# Patient Record
Sex: Female | Born: 1990 | Race: Black or African American | Hispanic: No | Marital: Married | Smoking: Never smoker
Health system: Southern US, Community
[De-identification: ages and names within clinical notes are randomized; demographics above are authoritative.]

## PROBLEM LIST (undated history)

## (undated) DIAGNOSIS — Z973 Presence of spectacles and contact lenses: Secondary | ICD-10-CM

## (undated) DIAGNOSIS — Z9049 Acquired absence of other specified parts of digestive tract: Secondary | ICD-10-CM

## (undated) DIAGNOSIS — N979 Female infertility, unspecified: Secondary | ICD-10-CM

## (undated) DIAGNOSIS — K5900 Constipation, unspecified: Secondary | ICD-10-CM

## (undated) DIAGNOSIS — K5909 Other constipation: Secondary | ICD-10-CM

## (undated) DIAGNOSIS — N736 Female pelvic peritoneal adhesions (postinfective): Secondary | ICD-10-CM

## (undated) DIAGNOSIS — E559 Vitamin D deficiency, unspecified: Secondary | ICD-10-CM

## (undated) HISTORY — DX: Vitamin D deficiency, unspecified: E55.9

---
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
## 2017-07-19 DIAGNOSIS — E559 Vitamin D deficiency, unspecified: Secondary | ICD-10-CM | POA: Insufficient documentation

## 2017-07-19 DIAGNOSIS — B009 Herpesviral infection, unspecified: Secondary | ICD-10-CM | POA: Insufficient documentation

## 2017-07-19 HISTORY — DX: Herpesviral infection, unspecified: B00.9

## 2017-07-19 LAB — TSH: TSH: 1.83 (ref <=5.90)

## 2017-07-19 LAB — VITAMIN D 25 HYDROXY (VIT D DEFICIENCY, FRACTURES): Vit D, 25-Hydroxy: 25

## 2017-07-19 LAB — HIV ANTIBODY (ROUTINE TESTING W REFLEX): HIV 1&2 Ab, 4th Generation: NONREACTIVE

## 2017-07-19 LAB — CBC AND DIFFERENTIAL: Hemoglobin: 13 (ref 12.0–16.0)

## 2017-07-19 LAB — HM HEPATITIS C SCREENING LAB: HM Hepatitis Screen: NEGATIVE

## 2017-07-19 LAB — HM HIV SCREENING LAB: HM HIV Screening: NEGATIVE

## 2017-07-19 LAB — HEPATITIS B SURFACE ANTIGEN: Hepatitis B Surface Ag: NONREACTIVE

## 2018-01-21 LAB — HEMOGLOBIN A1C: Hemoglobin A1C: 4.6

## 2018-01-31 LAB — VITAMIN D 25 HYDROXY (VIT D DEFICIENCY, FRACTURES): Vit D, 25-Hydroxy: 29

## 2018-04-26 DIAGNOSIS — N979 Female infertility, unspecified: Secondary | ICD-10-CM | POA: Diagnosis not present

## 2018-04-26 DIAGNOSIS — N898 Other specified noninflammatory disorders of vagina: Secondary | ICD-10-CM | POA: Diagnosis not present

## 2018-08-31 DIAGNOSIS — N979 Female infertility, unspecified: Secondary | ICD-10-CM | POA: Diagnosis not present

## 2018-08-31 DIAGNOSIS — E559 Vitamin D deficiency, unspecified: Secondary | ICD-10-CM | POA: Diagnosis not present

## 2018-08-31 DIAGNOSIS — R87610 Atypical squamous cells of undetermined significance on cytologic smear of cervix (ASC-US): Secondary | ICD-10-CM | POA: Diagnosis not present

## 2018-08-31 DIAGNOSIS — Z124 Encounter for screening for malignant neoplasm of cervix: Secondary | ICD-10-CM | POA: Diagnosis not present

## 2018-08-31 DIAGNOSIS — N898 Other specified noninflammatory disorders of vagina: Secondary | ICD-10-CM | POA: Diagnosis not present

## 2018-08-31 DIAGNOSIS — Z6829 Body mass index (BMI) 29.0-29.9, adult: Secondary | ICD-10-CM | POA: Diagnosis not present

## 2018-08-31 DIAGNOSIS — Z01411 Encounter for gynecological examination (general) (routine) with abnormal findings: Secondary | ICD-10-CM | POA: Diagnosis not present

## 2018-08-31 LAB — HM PAP SMEAR: HM Pap smear: NEGATIVE

## 2018-12-21 ENCOUNTER — Ambulatory Visit (INDEPENDENT_AMBULATORY_CARE_PROVIDER_SITE_OTHER): Payer: 59 | Admitting: Physician Assistant

## 2018-12-21 ENCOUNTER — Encounter: Payer: Self-pay | Admitting: Physician Assistant

## 2018-12-21 ENCOUNTER — Other Ambulatory Visit: Payer: Self-pay

## 2018-12-21 VITALS — BP 138/80 | HR 80 | Temp 98.3°F | Ht 62.5 in | Wt 163.0 lb

## 2018-12-21 DIAGNOSIS — E663 Overweight: Secondary | ICD-10-CM | POA: Diagnosis not present

## 2018-12-21 DIAGNOSIS — R635 Abnormal weight gain: Secondary | ICD-10-CM

## 2018-12-21 DIAGNOSIS — Z0001 Encounter for general adult medical examination with abnormal findings: Secondary | ICD-10-CM | POA: Diagnosis not present

## 2018-12-21 DIAGNOSIS — K5909 Other constipation: Secondary | ICD-10-CM | POA: Insufficient documentation

## 2018-12-21 DIAGNOSIS — N979 Female infertility, unspecified: Secondary | ICD-10-CM | POA: Diagnosis not present

## 2018-12-21 DIAGNOSIS — Z136 Encounter for screening for cardiovascular disorders: Secondary | ICD-10-CM

## 2018-12-21 DIAGNOSIS — Z13220 Encounter for screening for lipoid disorders: Secondary | ICD-10-CM | POA: Diagnosis not present

## 2018-12-21 NOTE — Patient Instructions (Signed)
It was great to see you!  Please go to the lab for blood work.   Our office will call you with your results unless you have chosen to receive results via MyChart.  If your blood work is normal we will follow-up each year for physicals and as scheduled for chronic medical problems.  If anything is abnormal we will treat accordingly and get you in for a follow-up.  *Goals for you: 1. Start daily prenatal vitamin 2. Work on cutting carbohydrate portions down -- you can still have them, but try to eat less! 3. Start a gentle basic exercise plan (ex -- walking 3 x a week for 20 min)  Take care,  [facility] Maintenance, Female Adopting a healthy lifestyle and getting preventive care are important in promoting health and wellness. Ask your health care provider about:  The right schedule for you to have regular tests and exams.  Things you can do on your own to prevent diseases and keep yourself healthy. What should I know about diet, weight, and exercise? Eat a healthy diet   Eat a diet that includes plenty of vegetables, fruits, low-fat dairy products, and lean protein.  Do not eat a lot of foods that are high in solid fats, added sugars, or sodium. Maintain a healthy weight Body mass index (BMI) is used to identify weight problems. It estimates body fat based on height and weight. Your health care provider can help determine your BMI and help you achieve or maintain a healthy weight. Get regular exercise Get regular exercise. This is one of the most important things you can do for your health. Most adults should:  Exercise for at least 150 minutes each week. The exercise should increase your heart rate and make you sweat (moderate-intensity exercise).  Do strengthening exercises at least twice a week. This is in addition to the moderate-intensity exercise.  Spend less time sitting. Even light physical activity can be beneficial. Watch cholesterol and blood lipids Have  your blood tested for lipids and cholesterol at 26 years of age, then have this test every 5 years. Have your cholesterol levels checked more often if:  Your lipid or cholesterol levels are high.  You are older than 26 years of age.  You are at high risk for heart disease. What should I know about cancer screening? Depending on your health history and family history, you may need to have cancer screening at various ages. This may include screening for:  Breast cancer.  Cervical cancer.  Colorectal cancer.  Skin cancer.  Lung cancer. What should I know about heart disease, diabetes, and high blood pressure? Blood pressure and heart disease  High blood pressure causes heart disease and increases the risk of stroke. This is more likely to develop in people who have high blood pressure readings, are of African descent, or are overweight.  Have your blood pressure checked: ? Every 3-5 years if you are 22-32 years of age. ? Every year if you are 22 years old or older. Diabetes Have regular diabetes screenings. This checks your fasting blood sugar level. Have the screening done:  Once every three years after age 42 if you are at a normal weight and have a low risk for diabetes.  More often and at a younger age if you are overweight or have a high risk for diabetes. What should I know about preventing infection? Hepatitis B If you have a higher risk for hepatitis B, you should be screened  for this virus. Talk with your health care provider to find out if you are at risk for hepatitis B infection. Hepatitis C Testing is recommended for:  Everyone born from [id number] through 1963.  Anyone with known risk factors for hepatitis C. Sexually transmitted infections (STIs)  Get screened for STIs, including gonorrhea and chlamydia, if: ? You are sexually active and are younger than 26 years of age. ? You are older than 26 years of age and your health care provider tells you that you are at  risk for this type of infection. ? Your sexual activity has changed since you were last screened, and you are at increased risk for chlamydia or gonorrhea. Ask your health care provider if you are at risk.  Ask your health care provider about whether you are at high risk for HIV. Your health care provider may recommend a prescription medicine to help prevent HIV infection. If you choose to take medicine to prevent HIV, you should first get tested for HIV. You should then be tested every 3 months for as long as you are taking the medicine. Pregnancy  If you are about to stop having your period (premenopausal) and you may become pregnant, seek counseling before you get pregnant.  Take 400 to 800 micrograms (mcg) of folic acid every day if you become pregnant.  Ask for birth control (contraception) if you want to prevent pregnancy. Osteoporosis and menopause Osteoporosis is a disease in which the bones lose minerals and strength with aging. This can result in bone fractures. If you are 26 years old or older, or if you are at risk for osteoporosis and fractures, ask your health care provider if you should:  Be screened for bone loss.  Take a calcium or vitamin D supplement to lower your risk of fractures.  Be given hormone replacement therapy (HRT) to treat symptoms of menopause. Follow these instructions at home: Lifestyle  Do not use any products that contain nicotine or tobacco, such as cigarettes, e-cigarettes, and chewing tobacco. If you need help quitting, ask your health care provider.  Do not use street drugs.  Do not share needles.  Ask your health care provider for help if you need support or information about quitting drugs. Alcohol use  Do not drink alcohol if: ? Your health care provider tells you not to drink. ? You are pregnant, may be pregnant, or are planning to become pregnant.  If you drink alcohol: ? Limit how much you use to 0-1 drink a day. ? Limit intake if you  are breastfeeding.  Be aware of how much alcohol is in your drink. In the [place] one drink equals one 12 oz bottle of beer (355 mL), one 5 oz glass of wine (148 mL), or one 1 oz glass of hard liquor (44 mL). General instructions  Schedule regular health, dental, and eye exams.  Stay current with your vaccines.  Tell your health care provider if: ? You often feel depressed. ? You have ever been abused or do not feel safe at home. Summary  Adopting a healthy lifestyle and getting preventive care are important in promoting health and wellness.  Follow your health care provider's instructions about healthy diet, exercising, and getting tested or screened for diseases.  Follow your health care provider's instructions on monitoring your cholesterol and blood pressure. This information is not intended to replace advice given to you by your health care provider. Make sure you discuss any questions you have with your health care  provider. Document Released: 10/06/2010 Document Revised: 03/16/2018 Document Reviewed: 03/16/2018 Elsevier Patient Education  2020 [employer].

## 2018-12-21 NOTE — Progress Notes (Signed)
Caitlin Robles is a 28 y.o. female here to Establish Care.  I acted as a Neurosurgeon for [employer], PA-C Corky Mull, LPN  History of Present Illness:   Chief Complaint  Patient presents with  . Establish Care  . Annual Exam    Not fasting    Acute Concerns: Weight gain --patient reports that she is currently at her highest weight that she is ever been a.  She endorses poor dietary habits, mostly eats large portions of sweets and carbs, and also does not exercise.  She has never tried any specific weight management program. Infertility --patient reports that she has been trying to get pregnant for 2 years, she is going to see a new fertility specialist next month.  She was on Clomid at one point, she is not on this anymore.  She denies any prior history or diagnosis of PCOS, hypertension, thyroid issues, blood sugar issues.  She is not on a prenatal at this time.  Chronic Issues: Chronic constipation --denies any significant issues with this, and is been a long-term issue for her.  She knows that it can be related to her diet and she tries to work on this as able.  Denies hemorrhoids or blood in stool.  Denies colon cancer in family.  Health Maintenance: Immunizations -- UTD Colonoscopy -- N/A Mammogram -- N/A PAP -- done 08/31/18, Normal  Bone Density -- N/A Diet --eats a wide variety of foods but states that she overeats carbohydrate portions Caffeine intake --minimal Sleep habits --good Exercise --none Weight -- Weight: 163 lb (73.9 kg)  Mood --good Alcohol --very occasional, social, no concern Tobacco --none  Depression screen PHQ 2/9 12/21/2018  Decreased Interest 0  Down, Depressed, Hopeless 0  PHQ - 2 Score 0    No flowsheet data found.   Other providers/specialists: Patient Care Team: Jarold Motto, [place] as PCP - General (Physician Assistant)   Past Medical History:  Diagnosis Date  . Constipation   . Vitamin D deficiency      Social History    Socioeconomic History  . Marital status: Married    Spouse name: Not on file  . Number of children: Not on file  . Years of education: Not on file  . Highest education level: Not on file  Occupational History  . Not on file  Social Needs  . Financial resource strain: Not on file  . Food insecurity    Worry: Not on file    Inability: Not on file  . Transportation needs    Medical: Not on file    Non-medical: Not on file  Tobacco Use  . Smoking status: Never Smoker  . Smokeless tobacco: Never Used  Substance and Sexual Activity  . Alcohol use: Not Currently  . Drug use: Never  . Sexual activity: Yes    Birth control/protection: None  Lifestyle  . Physical activity    Days per week: Not on file    Minutes per session: Not on file  . Stress: Not on file  Relationships  . Social Musician on phone: Not on file    Gets together: Not on file    Attends religious service: Not on file    Active member of club or organization: Not on file    Attends meetings of clubs or organizations: Not on file    Relationship status: Not on file  . Intimate partner violence    Fear of current or ex partner: Not on file  Emotionally abused: Not on file    Physically abused: Not on file    Forced sexual activity: Not on file  Other Topics Concern  . Not on file  Social History Narrative  . Not on file    Past Surgical History:  Procedure Laterality Date  . COLON SURGERY  09-16-1992   had part of intestines removed, she was premature    History reviewed. No pertinent family history.  No Known Allergies   Current Medications:   Current Outpatient Medications:  .  ibuprofen (ADVIL) 200 MG tablet, Take 400 mg by mouth as needed., Disp: , Rfl:    Review of Systems:   Review of Systems  Constitutional: Negative for chills, fever, malaise/fatigue and weight loss.  HENT: Negative for hearing loss, sinus pain and sore throat.   Eyes: Negative for blurred vision.   Respiratory: Negative for cough and shortness of breath.   Cardiovascular: Negative for chest pain, palpitations and leg swelling.  Gastrointestinal: Negative for abdominal pain, constipation, diarrhea, heartburn, nausea and vomiting.  Genitourinary: Negative for dysuria, frequency and urgency.  Musculoskeletal: Negative for back pain, myalgias and neck pain.  Skin: Negative for itching and rash.  Neurological: Negative for dizziness, tingling, seizures, loss of consciousness and headaches.  Endo/Heme/Allergies: Negative for polydipsia.  Psychiatric/Behavioral: Negative for depression. The patient is not nervous/anxious.   All other systems reviewed and are negative.   Vitals:   Vitals:   12/21/18 1445 12/21/18 1516  BP: 140/86 138/80  Pulse: 80   Temp: 98.3 F (36.8 C)   TempSrc: Temporal   Weight: 163 lb (73.9 kg)   Height: 5' 2.5" (1.588 m)       Body mass index is 29.34 kg/m.  Physical Exam:   Physical Exam Vitals signs and nursing note reviewed.  Constitutional:      General: She is not in acute distress.    Appearance: Normal appearance. She is well-developed. She is not ill-appearing or toxic-appearing.  HENT:     Head: Normocephalic and atraumatic.     Right Ear: Tympanic membrane, ear canal and external ear normal. Tympanic membrane is not erythematous, retracted or bulging.     Left Ear: Tympanic membrane, ear canal and external ear normal. Tympanic membrane is not erythematous, retracted or bulging.  Eyes:     General: Lids are normal.     Conjunctiva/sclera: Conjunctivae normal.     Pupils: Pupils are equal, round, and reactive to light.  Neck:     Musculoskeletal: Full passive range of motion without pain.     Trachea: Trachea normal.  Cardiovascular:     Rate and Rhythm: Normal rate and regular rhythm.     Heart sounds: Normal heart sounds, S1 normal and S2 normal.  Pulmonary:     Effort: Pulmonary effort is normal. No tachypnea or respiratory  distress.     Breath sounds: Normal breath sounds. No decreased breath sounds, wheezing, rhonchi or rales.  Abdominal:     General: Bowel sounds are normal.     Palpations: Abdomen is soft.     Tenderness: There is no abdominal tenderness.     Comments: Large abdominal scar present on abdomen  Musculoskeletal: Normal range of motion.  Lymphadenopathy:     Cervical: No cervical adenopathy.  Skin:    General: Skin is warm and dry.  Neurological:     Mental Status: She is alert.     GCS: GCS eye subscore is 4. GCS verbal subscore is 5. GCS motor subscore  is 6.     Cranial Nerves: No cranial nerve deficit.     Sensory: No sensory deficit.     Deep Tendon Reflexes: Reflexes are normal and symmetric.  Psychiatric:        Speech: Speech normal.        Behavior: Behavior normal. Behavior is cooperative.     No results found for this or any previous visit.  Assessment and Plan:   Emelia LoronKamiya was seen today for establish care and annual exam.  Diagnoses and all orders for this visit:  Encounter for general adult medical examination with abnormal findings Today patient counseled on age appropriate routine health concerns for screening and prevention, each reviewed and up to date or declined. Immunizations reviewed and up to date or declined. Labs ordered and reviewed. Risk factors for depression reviewed and negative. Hearing function and visual acuity are intact. ADLs screened and addressed as needed. Functional ability and level of safety reviewed and appropriate. Education, counseling and referrals performed based on assessed risks today. Patient provided with a copy of personalized plan for preventive services.  Chronic constipation Feels like this is controlled with diet, and knows what dietary changes to make.  Will also assess labs. -     CBC with Differential/Platelet -     Comprehensive metabolic panel  Infertility, female Will defer to infertility specialist.  I did recommend that  she start a prenatal today.  Encounter for lipid screening for cardiovascular disease -     Lipid panel  Weight gain; Overweight Recommended diet and exercise, especially working on starting some sort of small, basic exercise program prior to pregnancy.  . Reviewed expectations re: course of current medical issues. . Discussed self-management of symptoms. . Outlined signs and symptoms indicating need for more acute intervention. . Patient verbalized understanding and all questions were answered. . See orders for this visit as documented in the electronic medical record. . Patient received an After-Visit Summary.  CMA or LPN served as scribe during this visit. History, Physical, and Plan performed by medical provider. The above documentation has been reviewed and is accurate and complete.   Jarold MottoSamantha Migel Hannis, PA-C

## 2018-12-22 LAB — COMPREHENSIVE METABOLIC PANEL
ALT: 11 U/L (ref 0–35)
AST: 12 U/L (ref 0–37)
Albumin: 4.4 g/dL (ref 3.5–5.2)
Alkaline Phosphatase: 79 U/L (ref 39–117)
BUN: 9 mg/dL (ref 6–23)
CO2: 30 mEq/L (ref 19–32)
Calcium: 9.9 mg/dL (ref 8.4–10.5)
Chloride: 104 mEq/L (ref 96–112)
Creatinine, Ser: 0.86 mg/dL (ref 0.40–1.20)
GFR: 96.12 mL/min (ref >60.00)
Glucose, Bld: 82 mg/dL (ref 70–99)
Potassium: 4.1 mEq/L (ref 3.5–5.1)
Sodium: 139 mEq/L (ref 135–145)
Total Bilirubin: 0.4 mg/dL (ref 0.2–1.2)
Total Protein: 7 g/dL (ref 6.0–8.3)

## 2018-12-22 LAB — LIPID PANEL
Cholesterol: 154 mg/dL (ref 0–200)
HDL: 40.9 mg/dL (ref >39.00)
LDL Cholesterol: 94 mg/dL (ref 0–99)
NonHDL: 112.79
Total CHOL/HDL Ratio: 4
Triglycerides: 96 mg/dL (ref 0.0–149.0)
VLDL: 19.2 mg/dL (ref 0.0–40.0)

## 2018-12-22 LAB — CBC WITH DIFFERENTIAL/PLATELET
Basophils Absolute: 0.1 10*3/uL (ref 0.0–0.1)
Basophils Relative: 1.4 % (ref 0.0–3.0)
Eosinophils Absolute: 0.1 10*3/uL (ref 0.0–0.7)
Eosinophils Relative: 2.1 % (ref 0.0–5.0)
HCT: 38.5 % (ref 36.0–46.0)
Hemoglobin: 12.7 g/dL (ref 12.0–15.0)
Lymphocytes Relative: 39 % (ref 12.0–46.0)
Lymphs Abs: 2.5 10*3/uL (ref 0.7–4.0)
MCHC: 32.9 g/dL (ref 30.0–36.0)
MCV: 94.9 fl (ref 78.0–100.0)
Monocytes Absolute: 0.4 10*3/uL (ref 0.1–1.0)
Monocytes Relative: 5.9 % (ref 3.0–12.0)
Neutro Abs: 3.3 10*3/uL (ref 1.4–7.7)
Neutrophils Relative %: 51.6 % (ref 43.0–77.0)
Platelets: 339 10*3/uL (ref 150.0–400.0)
RBC: 4.05 Mil/uL (ref 3.87–5.11)
RDW: 11.8 % (ref 11.5–15.5)
WBC: 6.4 10*3/uL (ref 4.0–10.5)

## 2019-01-13 ENCOUNTER — Encounter: Payer: Self-pay | Admitting: Physician Assistant

## 2019-01-30 DIAGNOSIS — N971 Female infertility of tubal origin: Secondary | ICD-10-CM | POA: Diagnosis not present

## 2019-01-30 DIAGNOSIS — Z319 Encounter for procreative management, unspecified: Secondary | ICD-10-CM | POA: Diagnosis not present

## 2019-01-30 DIAGNOSIS — Z3141 Encounter for fertility testing: Secondary | ICD-10-CM | POA: Diagnosis not present

## 2019-01-30 DIAGNOSIS — Z3161 Procreative counseling and advice using natural family planning: Secondary | ICD-10-CM | POA: Diagnosis not present

## 2019-02-08 DIAGNOSIS — Z3202 Encounter for pregnancy test, result negative: Secondary | ICD-10-CM | POA: Diagnosis not present

## 2019-02-08 DIAGNOSIS — Z3141 Encounter for fertility testing: Secondary | ICD-10-CM | POA: Diagnosis not present

## 2019-04-24 ENCOUNTER — Encounter: Payer: Self-pay | Admitting: Physician Assistant

## 2019-04-24 DIAGNOSIS — Z111 Encounter for screening for respiratory tuberculosis: Secondary | ICD-10-CM

## 2019-05-03 ENCOUNTER — Other Ambulatory Visit: Payer: Self-pay

## 2019-05-03 ENCOUNTER — Ambulatory Visit: Payer: 59

## 2019-05-05 ENCOUNTER — Other Ambulatory Visit: Payer: Self-pay

## 2019-05-05 ENCOUNTER — Ambulatory Visit (INDEPENDENT_AMBULATORY_CARE_PROVIDER_SITE_OTHER): Payer: 59

## 2019-05-05 DIAGNOSIS — Z111 Encounter for screening for respiratory tuberculosis: Secondary | ICD-10-CM

## 2019-05-05 NOTE — Progress Notes (Signed)
Patient presents in nurse clinic for PPD test. PPD applied to left ventral forearm, wheel present. Patient to return to nurse clinic on 2/1 to have site read.

## 2019-05-08 ENCOUNTER — Other Ambulatory Visit: Payer: Self-pay

## 2019-05-08 ENCOUNTER — Ambulatory Visit: Payer: 59

## 2019-05-08 DIAGNOSIS — Z111 Encounter for screening for respiratory tuberculosis: Secondary | ICD-10-CM

## 2019-05-08 LAB — TB SKIN TEST
Induration: 0 mm
TB Skin Test: NEGATIVE

## 2019-05-08 NOTE — Progress Notes (Signed)
PPD Reading Note  PPD read and results entered in EpicCare.  Result: 0 mm induration.  Interpretation: Negative  Allergic reaction: no

## 2019-05-24 DIAGNOSIS — L910 Hypertrophic scar: Secondary | ICD-10-CM | POA: Diagnosis not present

## 2019-05-25 DIAGNOSIS — N971 Female infertility of tubal origin: Secondary | ICD-10-CM | POA: Diagnosis not present

## 2019-05-25 DIAGNOSIS — Z3161 Procreative counseling and advice using natural family planning: Secondary | ICD-10-CM | POA: Diagnosis not present

## 2019-05-29 ENCOUNTER — Ambulatory Visit
Admission: EM | Admit: 2019-05-29 | Discharge: 2019-05-29 | Disposition: A | Discharge status: Home / Self Care | Payer: 59 | Attending: Family Medicine | Admitting: Family Medicine

## 2019-05-29 DIAGNOSIS — Z20822 Contact with and (suspected) exposure to covid-19: Secondary | ICD-10-CM | POA: Insufficient documentation

## 2019-05-29 DIAGNOSIS — J029 Acute pharyngitis, unspecified: Secondary | ICD-10-CM | POA: Diagnosis not present

## 2019-05-29 DIAGNOSIS — J988 Other specified respiratory disorders: Secondary | ICD-10-CM

## 2019-05-29 DIAGNOSIS — B9789 Other viral agents as the cause of diseases classified elsewhere: Secondary | ICD-10-CM

## 2019-05-29 LAB — GROUP A STREP BY PCR: Group A Strep by PCR: NOT DETECTED

## 2019-05-29 NOTE — ED Provider Notes (Signed)
[facility]    CSN: [id number] Arrival date & time: 05/29/19  1456      History   Chief Complaint Chief Complaint  Patient presents with  . Sore Throat   HPI  29 year old female presents with sore throat, congestion, runny nose, and cough.  Symptoms started on Saturday.  She reports sore throat, congestion, runny nose, and cough.  No known exposure to COVID-19.  Rates her pain as 4/10 in severity.  No relieving factors.  No fever.  No other associated symptoms.  No other complaints.  Past Medical History:  Diagnosis Date  . Constipation   . Vitamin D deficiency     Patient Active Problem List   Diagnosis Date Noted  . Chronic constipation 12/21/2018  . Vitamin D deficiency 07/19/2017  . 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 COLON SURGERY  09-25-1992   had part of intestines removed, she was premature    OB History   No obstetric history on file.      Home Medications    Prior to Admission medications   Medication Sig Start Date End Date Taking? Authorizing Provider  ibuprofen (ADVIL) 200 MG tablet Take 400 mg by mouth as needed.    [provider]    Family History Family History  Problem Relation Age of Onset  . Asthma Mother   . Asthma Brother   . Diabetes Maternal Grandmother   . Hypertension Maternal Grandmother   . Diabetes Maternal Grandfather   . Hypertension Maternal Grandfather   . Diabetes Paternal Grandmother   . Hypertension Paternal Grandmother   . Diabetes Paternal Grandfather   . Heart attack Paternal Grandfather   . Hypertension Paternal Grandfather   . Healthy Father     Social History Social History   Tobacco Use  . Smoking status: Never Smoker  . Smokeless tobacco: Never Used  Substance Use Topics  . Alcohol use: Not Currently  . Drug use: Never   Allergies   Patient has no known allergies.  Review of Systems Review of Systems    Constitutional: Negative for fever.  HENT: Positive for congestion, rhinorrhea and sore throat.   Respiratory: Positive for cough.    Physical Exam Triage Vital Signs ED Triage Vitals  Enc Vitals Group     BP 05/29/19 1517 119/82     Pulse Rate 05/29/19 1517 84     Resp 05/29/19 1517 18     Temp 05/29/19 1517 98.8 F (37.1 C)     Temp src --      SpO2 05/29/19 1517 99 %     Weight --      Height --      Head Circumference --      Peak Flow --      Pain Score 05/29/19 1515 4     Pain Loc --      Pain Edu? --      Excl. in GC? --    Updated Vital Signs BP 119/82   Pulse 84   Temp 98.8 F (37.1 C)   Resp 18   LMP 05/01/2019   SpO2 99%   Visual Acuity Right Eye Distance:   Left Eye Distance:   Bilateral Distance:    Right Eye Near:   Left Eye Near:    Bilateral Near:     Physical Exam Vitals and nursing note reviewed.  Constitutional:      General: She is  not in acute distress.    Appearance: Normal appearance. She is not ill-appearing.  HENT:     Head: Normocephalic and atraumatic.     Mouth/Throat:     Pharynx: No oropharyngeal exudate.  Eyes:     General:        Right eye: No discharge.        Left eye: No discharge.     Conjunctiva/sclera: Conjunctivae normal.  Cardiovascular:     Rate and Rhythm: Normal rate and regular rhythm.     Heart sounds: No murmur.  Pulmonary:     Effort: Pulmonary effort is normal.     Breath sounds: Normal breath sounds.  Neurological:     Mental Status: She is alert.  Psychiatric:        Mood and Affect: Mood normal.        Behavior: Behavior normal.    UC Treatments / Results  Labs (all labs ordered are listed, but only abnormal results are displayed) Labs Reviewed  GROUP A STREP BY PCR  NOVEL CORONAVIRUS, NAA (HOSP ORDER, SEND-OUT TO REF LAB; TAT 18-24 HRS)    EKG   Radiology No results found.  Procedures Procedures (including critical care time)  Medications Ordered in UC Medications - No data to  display  Initial Impression / Assessment and Plan / UC Course  I have reviewed the triage vital signs and the nursing notes.  Pertinent labs & imaging results that were available during my care of the patient were reviewed by me and considered in my medical decision making (see chart for details).    29 year old female presents with a viral respiratory illness.  Strep negative.  Awaiting Covid test results.  Work note given.  Final Clinical Impressions(s) / UC Diagnoses   Final diagnoses:  Viral respiratory illness     Discharge Instructions     Rest.  Fluids.  Ibuprofen.  Results available in 24 to 48 hours.  Stay home.  Take care  Dr. Lacinda Axon      ED Prescriptions    None     PDMP not reviewed this encounter.   Coral Spikes, [place] 05/29/19 [phone]

## 2019-05-29 NOTE — Discharge Instructions (Signed)
Rest.  Fluids.  Ibuprofen.  Results available in 24 to 48 hours.  Stay home.  Take care  Dr. Adriana Simas

## 2019-05-29 NOTE — ED Triage Notes (Signed)
Pt presents with complaints of sore throat, congestion, runny nose and cough since Saturday. Denies any close exposure to COVID.

## 2019-05-30 LAB — NOVEL CORONAVIRUS, NAA (HOSP ORDER, SEND-OUT TO REF LAB; TAT 18-24 HRS): SARS-CoV-2, NAA: NOT DETECTED

## 2019-06-06 ENCOUNTER — Encounter (HOSPITAL_BASED_OUTPATIENT_CLINIC_OR_DEPARTMENT_OTHER): Payer: Self-pay | Admitting: Obstetrics and Gynecology

## 2019-06-06 ENCOUNTER — Other Ambulatory Visit: Payer: Self-pay

## 2019-06-06 NOTE — Progress Notes (Signed)
Spoke w/ via phone for pre-op interview--- PT Lab needs dos---- Urine preg (pre-op orders pending)              Lab results------ Urine preg COVID test ------ 06-06-2019 @ 1500 Arrive at ------- 0900 NPO after ------ MN Medications to take morning of surgery ----- NONE Diabetic medication ----- n/a Patient Special Instructions ----- n/a Pre-Op special Istructions ----- pt case just added on today Patient verbalized understanding of instructions that were given at this phone interview. Patient denies shortness of breath, chest pain, fever, cough a this phone interview.

## 2019-06-12 ENCOUNTER — Other Ambulatory Visit (HOSPITAL_COMMUNITY): Payer: 59

## 2019-06-14 ENCOUNTER — Ambulatory Visit (HOSPITAL_BASED_OUTPATIENT_CLINIC_OR_DEPARTMENT_OTHER): Admission: RE | Admit: 2019-06-14 | Payer: 59 | Source: Home / Self Care | Admitting: Obstetrics and Gynecology

## 2019-06-14 HISTORY — DX: Female infertility, unspecified: N97.9

## 2019-06-14 HISTORY — DX: Other constipation: K59.09

## 2019-06-14 HISTORY — DX: Female pelvic peritoneal adhesions (postinfective): N73.6

## 2019-06-14 HISTORY — DX: Acquired absence of other specified parts of digestive tract: Z90.49

## 2019-06-14 SURGERY — LYSIS, ADHESIONS, LAPAROSCOPIC
Anesthesia: General | Laterality: Left

## 2019-07-06 ENCOUNTER — Other Ambulatory Visit: Payer: Self-pay

## 2019-07-06 ENCOUNTER — Ambulatory Visit (INDEPENDENT_AMBULATORY_CARE_PROVIDER_SITE_OTHER): Payer: 59 | Admitting: Dermatology

## 2019-07-06 ENCOUNTER — Encounter: Payer: Self-pay | Admitting: Dermatology

## 2019-07-06 DIAGNOSIS — L910 Hypertrophic scar: Secondary | ICD-10-CM | POA: Diagnosis not present

## 2019-07-06 MED ORDER — TRIAMCINOLONE ACETONIDE 40 MG/ML IJ SUSP
40.0000 mg | Freq: Once | INTRAMUSCULAR | Status: AC
  Administered 2019-07-06: 40 mg

## 2019-07-06 NOTE — Patient Instructions (Signed)

## 2019-07-11 ENCOUNTER — Telehealth: Payer: Self-pay

## 2019-07-11 NOTE — Telephone Encounter (Signed)
Pathology given to patient, patient stated she has a follow up in May with Dr Jorja Loa.

## 2019-07-11 NOTE — Telephone Encounter (Signed)
-----   Message from [name], MD sent at 07/11/2019  6:59 AM EDT ----- Benign keloid, does NOT need referral for [facility]. Dr. Karie Schwalbe

## 2019-07-11 NOTE — Telephone Encounter (Signed)
-----   Message from [name], MD sent at 07/11/2019  6:05 AM EDT ----- Schedule Mohs

## 2019-07-29 DIAGNOSIS — Z7251 High risk heterosexual behavior: Secondary | ICD-10-CM | POA: Diagnosis not present

## 2019-07-29 DIAGNOSIS — N760 Acute vaginitis: Secondary | ICD-10-CM | POA: Diagnosis not present

## 2019-07-29 DIAGNOSIS — N898 Other specified noninflammatory disorders of vagina: Secondary | ICD-10-CM | POA: Diagnosis not present

## 2019-08-09 ENCOUNTER — Ambulatory Visit: Payer: 59 | Admitting: Dermatology

## 2019-08-15 ENCOUNTER — Encounter: Payer: Self-pay | Admitting: Dermatology

## 2019-08-15 ENCOUNTER — Ambulatory Visit (INDEPENDENT_AMBULATORY_CARE_PROVIDER_SITE_OTHER): Payer: 59 | Admitting: Dermatology

## 2019-08-15 ENCOUNTER — Other Ambulatory Visit: Payer: Self-pay

## 2019-08-15 ENCOUNTER — Encounter (HOSPITAL_BASED_OUTPATIENT_CLINIC_OR_DEPARTMENT_OTHER): Payer: Self-pay | Admitting: Obstetrics and Gynecology

## 2019-08-15 DIAGNOSIS — L910 Hypertrophic scar: Secondary | ICD-10-CM | POA: Diagnosis not present

## 2019-08-15 MED ORDER — TRIAMCINOLONE ACETONIDE 40 MG/ML IJ SUSP
40.0000 mg | Freq: Once | INTRAMUSCULAR | Status: AC
  Administered 2019-08-15: 15:00:00 40 mg

## 2019-08-15 NOTE — Progress Notes (Signed)
Spoke w/ via phone for pre-op interview--- PT Lab needs dos---- Urine preg (pre-op orders pending              Lab results------ no COVID test ------ 08-16-2019 @ 1500 Arrive at ------- 1100 NPO after ------ MN w/ exception clear liquids until 0700 then nothing by mouth (no cream/ milk products) Medications to take morning of surgery ----- NONE Diabetic medication ----- n/a Patient Special Instructions ----- case just add-on late today, pre-op orders pending Pre-Op special Istructions ----- Patient verbalized understanding of instructions that were given at this phone interview. Patient denies shortness of breath, chest pain, fever, cough a this phone interview.

## 2019-08-16 ENCOUNTER — Other Ambulatory Visit (HOSPITAL_COMMUNITY)
Admission: RE | Admit: 2019-08-16 | Discharge: 2019-08-16 | Disposition: A | Discharge status: Home / Self Care | Payer: 59 | Source: Ambulatory Visit | Attending: Obstetrics and Gynecology | Admitting: Obstetrics and Gynecology

## 2019-08-16 DIAGNOSIS — Z01812 Encounter for preprocedural laboratory examination: Secondary | ICD-10-CM | POA: Insufficient documentation

## 2019-08-16 DIAGNOSIS — Z20822 Contact with and (suspected) exposure to covid-19: Secondary | ICD-10-CM | POA: Diagnosis not present

## 2019-08-16 LAB — SARS CORONAVIRUS 2 (TAT 6-24 HRS): SARS Coronavirus 2: NEGATIVE

## 2019-08-18 ENCOUNTER — Ambulatory Visit (HOSPITAL_BASED_OUTPATIENT_CLINIC_OR_DEPARTMENT_OTHER)
Admission: RE | Admit: 2019-08-18 | Discharge: 2019-08-18 | Disposition: A | Discharge status: Home / Self Care | Payer: 59 | Attending: Obstetrics and Gynecology | Admitting: Obstetrics and Gynecology

## 2019-08-18 ENCOUNTER — Ambulatory Visit (HOSPITAL_BASED_OUTPATIENT_CLINIC_OR_DEPARTMENT_OTHER): Payer: 59 | Admitting: Anesthesiology

## 2019-08-18 ENCOUNTER — Other Ambulatory Visit: Payer: Self-pay

## 2019-08-18 ENCOUNTER — Encounter (HOSPITAL_BASED_OUTPATIENT_CLINIC_OR_DEPARTMENT_OTHER)
Admission: RE | Disposition: A | Discharge status: Home / Self Care | Payer: Self-pay | Source: Home / Self Care | Attending: Obstetrics and Gynecology

## 2019-08-18 ENCOUNTER — Encounter (HOSPITAL_BASED_OUTPATIENT_CLINIC_OR_DEPARTMENT_OTHER): Payer: Self-pay | Admitting: Obstetrics and Gynecology

## 2019-08-18 DIAGNOSIS — N7011 Chronic salpingitis: Secondary | ICD-10-CM | POA: Diagnosis not present

## 2019-08-18 DIAGNOSIS — N736 Female pelvic peritoneal adhesions (postinfective): Secondary | ICD-10-CM | POA: Insufficient documentation

## 2019-08-18 DIAGNOSIS — K5909 Other constipation: Secondary | ICD-10-CM | POA: Diagnosis not present

## 2019-08-18 DIAGNOSIS — K660 Peritoneal adhesions (postprocedural) (postinfection): Secondary | ICD-10-CM | POA: Diagnosis not present

## 2019-08-18 DIAGNOSIS — N979 Female infertility, unspecified: Secondary | ICD-10-CM | POA: Diagnosis not present

## 2019-08-18 DIAGNOSIS — B0089 Other herpesviral infection: Secondary | ICD-10-CM | POA: Diagnosis not present

## 2019-08-18 DIAGNOSIS — E559 Vitamin D deficiency, unspecified: Secondary | ICD-10-CM | POA: Diagnosis not present

## 2019-08-18 HISTORY — PX: LAPAROSCOPIC UNILATERAL SALPINGECTOMY: SHX5934

## 2019-08-18 LAB — POCT PREGNANCY, URINE: Preg Test, Ur: NEGATIVE

## 2019-08-18 SURGERY — SALPINGECTOMY, UNILATERAL, LAPAROSCOPIC
Anesthesia: General | Site: Abdomen | Laterality: Left

## 2019-08-18 MED ORDER — FENTANYL CITRATE (PF) 100 MCG/2ML IJ SOLN: 25.0000 ug | INTRAMUSCULAR | Status: DC | PRN

## 2019-08-18 MED ORDER — MIDAZOLAM HCL 2 MG/2ML IJ SOLN
INTRAMUSCULAR | Status: DC | PRN
  Administered 2019-08-18: 2 mg via INTRAVENOUS

## 2019-08-18 MED ORDER — LIDOCAINE HCL (CARDIAC) PF 100 MG/5ML IV SOSY
PREFILLED_SYRINGE | INTRAVENOUS | Status: DC | PRN
  Administered 2019-08-18: 80 mg via INTRAVENOUS

## 2019-08-18 MED ORDER — ACETAMINOPHEN 500 MG PO TABS
ORAL_TABLET | ORAL | Status: AC
  Filled 2019-08-18: qty 2

## 2019-08-18 MED ORDER — PROMETHAZINE HCL 25 MG/ML IJ SOLN: 6.2500 mg | INTRAMUSCULAR | Status: DC | PRN

## 2019-08-18 MED ORDER — KETOROLAC TROMETHAMINE 30 MG/ML IJ SOLN
INTRAMUSCULAR | Status: DC | PRN
Start: 2019-08-18 — End: 2019-08-18
  Administered 2019-08-18: 30 mg via INTRAVENOUS

## 2019-08-18 MED ORDER — CEFAZOLIN SODIUM-DEXTROSE 2-4 GM/100ML-% IV SOLN
2.0000 g | INTRAVENOUS | Status: AC
  Administered 2019-08-18: 2 g via INTRAVENOUS

## 2019-08-18 MED ORDER — LACTATED RINGERS IV SOLN: INTRAVENOUS | Status: DC

## 2019-08-18 MED ORDER — METHYLENE BLUE 0.5 % INJ SOLN
INTRAVENOUS | Status: DC | PRN
  Administered 2019-08-18: 1 mL via SUBMUCOSAL

## 2019-08-18 MED ORDER — SCOPOLAMINE 1 MG/3DAYS TD PT72
1.0000 | MEDICATED_PATCH | Freq: Once | TRANSDERMAL | Status: DC
  Administered 2019-08-18: 1.5 mg via TRANSDERMAL

## 2019-08-18 MED ORDER — FENTANYL CITRATE (PF) 250 MCG/5ML IJ SOLN
INTRAMUSCULAR | Status: AC
  Filled 2019-08-18: qty 5

## 2019-08-18 MED ORDER — ONDANSETRON HCL 4 MG/2ML IJ SOLN
INTRAMUSCULAR | Status: DC | PRN
  Administered 2019-08-18: 4 mg via INTRAVENOUS

## 2019-08-18 MED ORDER — ONDANSETRON HCL 4 MG/2ML IJ SOLN
INTRAMUSCULAR | Status: AC
  Filled 2019-08-18: qty 2

## 2019-08-18 MED ORDER — LIDOCAINE 2% (20 MG/ML) 5 ML SYRINGE
INTRAMUSCULAR | Status: AC
  Filled 2019-08-18: qty 5

## 2019-08-18 MED ORDER — OXYCODONE HCL 5 MG/5ML PO SOLN: 5.0000 mg | Freq: Once | ORAL | Status: DC | PRN

## 2019-08-18 MED ORDER — SUGAMMADEX SODIUM 200 MG/2ML IV SOLN
INTRAVENOUS | Status: DC | PRN
  Administered 2019-08-18: 200 mg via INTRAVENOUS
  Administered 2019-08-18: 25 mg via INTRAVENOUS

## 2019-08-18 MED ORDER — SCOPOLAMINE 1 MG/3DAYS TD PT72
MEDICATED_PATCH | TRANSDERMAL | Status: AC
  Filled 2019-08-18: qty 1

## 2019-08-18 MED ORDER — OXYCODONE HCL 5 MG PO TABS: 5.0000 mg | ORAL_TABLET | Freq: Once | ORAL | Status: DC | PRN

## 2019-08-18 MED ORDER — OXYCODONE-ACETAMINOPHEN 7.5-325 MG PO TABS: 1.0000 | ORAL_TABLET | ORAL | 0 refills | Status: DC | PRN

## 2019-08-18 MED ORDER — CEFAZOLIN SODIUM-DEXTROSE 2-4 GM/100ML-% IV SOLN
INTRAVENOUS | Status: AC
  Filled 2019-08-18: qty 100

## 2019-08-18 MED ORDER — ACETAMINOPHEN 500 MG PO TABS
1000.0000 mg | ORAL_TABLET | Freq: Once | ORAL | Status: AC
  Administered 2019-08-18: 1000 mg via ORAL

## 2019-08-18 MED ORDER — ONDANSETRON HCL 4 MG PO TABS: 4.0000 mg | ORAL_TABLET | Freq: Every day | ORAL | 1 refills | Status: DC | PRN

## 2019-08-18 MED ORDER — ROCURONIUM BROMIDE 10 MG/ML (PF) SYRINGE
PREFILLED_SYRINGE | INTRAVENOUS | Status: AC
  Filled 2019-08-18: qty 10

## 2019-08-18 MED ORDER — PROPOFOL 10 MG/ML IV BOLUS
INTRAVENOUS | Status: DC | PRN
  Administered 2019-08-18: 140 mg via INTRAVENOUS

## 2019-08-18 MED ORDER — PROPOFOL 10 MG/ML IV BOLUS
INTRAVENOUS | Status: AC
  Filled 2019-08-18: qty 20

## 2019-08-18 MED ORDER — DEXAMETHASONE SODIUM PHOSPHATE 4 MG/ML IJ SOLN
INTRAMUSCULAR | Status: DC | PRN
  Administered 2019-08-18: 10 mg via INTRAVENOUS

## 2019-08-18 MED ORDER — DEXAMETHASONE SODIUM PHOSPHATE 10 MG/ML IJ SOLN
INTRAMUSCULAR | Status: AC
  Filled 2019-08-18: qty 1

## 2019-08-18 MED ORDER — BUPIVACAINE-EPINEPHRINE 0.25% -1:200000 IJ SOLN
INTRAMUSCULAR | Status: DC | PRN
  Administered 2019-08-18: 10 mL

## 2019-08-18 MED ORDER — KETOROLAC TROMETHAMINE 30 MG/ML IJ SOLN
INTRAMUSCULAR | Status: AC
  Filled 2019-08-18: qty 1

## 2019-08-18 MED ORDER — ROCURONIUM BROMIDE 100 MG/10ML IV SOLN
INTRAVENOUS | Status: DC | PRN
  Administered 2019-08-18: 50 mg via INTRAVENOUS
  Administered 2019-08-18 (×2): 20 mg via INTRAVENOUS

## 2019-08-18 MED ORDER — FENTANYL CITRATE (PF) 100 MCG/2ML IJ SOLN
INTRAMUSCULAR | Status: DC | PRN
  Administered 2019-08-18 (×7): 50 ug via INTRAVENOUS

## 2019-08-18 MED ORDER — MIDAZOLAM HCL 2 MG/2ML IJ SOLN
INTRAMUSCULAR | Status: AC
  Filled 2019-08-18: qty 2

## 2019-08-18 SURGICAL SUPPLY — 43 items
BAG RETRIEVAL 10MM (BASKET)
CABLE HIGH FREQUENCY MONO STRZ (ELECTRODE) ×3 IMPLANT
CATH ROBINSON RED A/P 18FR (CATHETERS) IMPLANT
CNTNR URN SCR LID CUP LEK RST (MISCELLANEOUS) IMPLANT
CONT SPEC 4OZ STRL OR WHT (MISCELLANEOUS)
COVER MAYO STAND STRL (DRAPES) ×3 IMPLANT
COVER WAND RF STERILE (DRAPES) ×3 IMPLANT
DERMABOND ADVANCED (GAUZE/BANDAGES/DRESSINGS) ×2
DERMABOND ADVANCED .7 DNX12 (GAUZE/BANDAGES/DRESSINGS) ×1 IMPLANT
DRSG COVADERM PLUS 2X2 (GAUZE/BANDAGES/DRESSINGS) IMPLANT
DRSG OPSITE POSTOP 3X4 (GAUZE/BANDAGES/DRESSINGS) IMPLANT
DURAPREP 26ML APPLICATOR (WOUND CARE) ×3 IMPLANT
ELECT REM PT RETURN 9FT ADLT (ELECTROSURGICAL) ×3
ELECTRODE REM PT RTRN 9FT ADLT (ELECTROSURGICAL) ×1 IMPLANT
GAUZE 4X4 16PLY RFD (DISPOSABLE) ×3 IMPLANT
GLOVE BIO SURGEON STRL SZ8 (GLOVE) ×6 IMPLANT
GOWN STRL REUS W/TWL LRG LVL3 (GOWN DISPOSABLE) ×3 IMPLANT
MANIPULATOR UTERINE 4.5 ZUMI (MISCELLANEOUS) ×3 IMPLANT
NEEDLE INSUFFLATION 120MM (ENDOMECHANICALS) ×3 IMPLANT
NEEDLE SPNL 18GX3.5 QUINCKE PK (NEEDLE) ×3 IMPLANT
NEEDLE SPNL 22GX3.5 QUINCKE BK (NEEDLE) ×3 IMPLANT
PACK LAPAROSCOPY BASIN (CUSTOM PROCEDURE TRAY) ×3 IMPLANT
PACK TRENDGUARD 450 HYBRID PRO (MISCELLANEOUS) ×1 IMPLANT
SEPRAFILM MEMBRANE 5X6 (MISCELLANEOUS) IMPLANT
SET SUCTION IRRIG HYDROSURG (IRRIGATION / IRRIGATOR) ×3 IMPLANT
SET TUBE SMOKE EVAC HIGH FLOW (TUBING) ×3 IMPLANT
SHEARS HARMONIC ACE PLUS 36CM (ENDOMECHANICALS) IMPLANT
SHEARS HARMONIC ACE PLUS 45CM (MISCELLANEOUS) ×3 IMPLANT
SUT MNCRL AB 4-0 PS2 18 (SUTURE) ×3 IMPLANT
SYR 30ML LL (SYRINGE) ×3 IMPLANT
SYR 50ML LL SCALE MARK (SYRINGE) IMPLANT
SYR 5ML LL (SYRINGE) ×6 IMPLANT
SYR CONTROL 10ML LL (SYRINGE) IMPLANT
SYR TOOMEY IRRIG 70ML (MISCELLANEOUS) ×3
SYRINGE TOOMEY IRRIG 70ML (MISCELLANEOUS) ×1 IMPLANT
SYS BAG RETRIEVAL 10MM (BASKET)
SYSTEM BAG RETRIEVAL 10MM (BASKET) IMPLANT
TOWEL OR 17X26 10 PK STRL BLUE (TOWEL DISPOSABLE) ×3 IMPLANT
TRAY FOLEY W/BAG SLVR 14FR LF (SET/KITS/TRAYS/PACK) ×3 IMPLANT
TRENDGUARD 450 HYBRID PRO PACK (MISCELLANEOUS) ×3
TROCAR OPTI TIP 5M 100M (ENDOMECHANICALS) ×9 IMPLANT
TROCAR XCEL DIL TIP R 11M (ENDOMECHANICALS) ×3 IMPLANT
WARMER LAPAROSCOPE (MISCELLANEOUS) ×3 IMPLANT

## 2019-08-18 NOTE — Anesthesia Preprocedure Evaluation (Addendum)
Anesthesia Evaluation  Patient identified by MRN, date of birth, ID band Patient awake    Reviewed: Allergy & Precautions, NPO status , Patient's Chart, lab work & pertinent test results  History of Anesthesia Complications Negative for: history of anesthetic complications  Airway Mallampati: I  TM Distance: >3 FB Neck ROM: Full    Dental no notable dental hx.    Pulmonary neg pulmonary ROS,    Pulmonary exam normal        Cardiovascular negative cardio ROS Normal cardiovascular exam     Neuro/Psych negative neurological ROS  negative psych ROS   GI/Hepatic negative GI ROS, Neg liver ROS,   Endo/Other  negative endocrine ROS  Renal/GU negative Renal ROS  negative genitourinary   Musculoskeletal negative musculoskeletal ROS (+)   Abdominal   Peds  Hematology negative hematology ROS (+)   Anesthesia Other Findings Day of surgery medications reviewed with patient.  Reproductive/Obstetrics CHRONIC SALPINGYITIS                            Anesthesia Physical Anesthesia Plan  ASA: I  Anesthesia Plan: General   Post-op Pain Management:    Induction: Intravenous  PONV Risk Score and Plan: 4 or greater and Treatment may vary due to age or medical condition, Ondansetron, Dexamethasone, Midazolam and Scopolamine patch - Pre-op  Airway Management Planned: Oral ETT  Additional Equipment: None  Intra-op Plan:   Post-operative Plan: Extubation in OR  Informed Consent: I have reviewed the patients History and Physical, chart, labs and discussed the procedure including the risks, benefits and alternatives for the proposed anesthesia with the patient or authorized representative who has indicated his/her understanding and acceptance.     Dental advisory given  Plan Discussed with: CRNA  Anesthesia Plan Comments:        Anesthesia Quick Evaluation

## 2019-08-18 NOTE — Transfer of Care (Signed)
Immediate Anesthesia Transfer of Care Note  Patient: Caitlin Robles  Procedure(s) Performed: Procedure(s) (LRB): DIAGNOSTIC LAPAROSCOPY, , LYSIS OF ADHESIONS, CHROMOPERTUBATION (Left)  Patient Location: PACU  Anesthesia Type: General  Level of Consciousness: awake, sedated, patient cooperative and responds to stimulation  Airway & Oxygen Therapy: Patient Spontanous Breathing and Patient connected to [place] 02 and soft FM   Post-op Assessment: Report given to PACU RN, Post -op Vital signs reviewed and stable and Patient moving all extremities  Post vital signs: Reviewed and stable  Complications: No apparent anesthesia complications

## 2019-08-18 NOTE — Op Note (Signed)
Operative Note  Preoperative diagnosis: Left hydrosalpinx, postsurgical pelvic adhesions  Postoperative diagnosis: Severe pelvic and abdominal adhesive disease  Procedure: Laparoscopy, lysis of adhesions, enterolysis (120 minutes), chromotubation  Anesthesia: Gen. endotracheal  Complications: None  Estimated blood loss: <20 cc  Specimens: None  Findings: There were 2 wide transverse laparotomy scars 5 cm above and 5 cm below the umbilicus.  On laparoscopy, initially only small peritoneal pockets, approximately 5-6 cm in diameter were entered.  After lysis of adhesions the left lower quadrant abdominal adhesions were partially freed up.  The separately the right lower quadrant abdominal adhesions were partially freed up. Severity of the adhesions did not allow further dissection and visualization of pelvic organs, except for a 1 cm segment of the right fallopian tube that passed the methylene blue injected transcervically.  Description of the procedure: The patient was placed in dorsal supine position and general endotracheal anesthesia was given. 2 g of cefazolin were given intravenously for prophylaxis. Patient was placed in lithotomy position. She was prepped and draped inside manner.  The bladder was catheterized . A ZUMI catheter was placed into the uterine cavity.  The uterus sounded to 8 cm.  The surgeon was regloved and a surgical field was created on the abdomen.  After preemptive anesthesia of all surgical sites with 0.25% bupivacaine, first we made a incision at the intersection of the costal margin with the left anterior axillary line, in anticipation of mid abdominal wall adhesions. Verress needle was inserted and its correct location was verified. A pneumoperitoneum was created with carbon dioxide.  5 mm laparoscope with a 30 lens was inserted and video laparoscopy was started .  We later extended this incision to 10 mm to allow insertion of the operative laparoscope through which  harmonic Ace and Maryland forceps were inserted for lysis of adhesions via high single puncture.   Additional punctures were made in the right lateral abdominal wall after probing the planned puncture site with an 18-gauge needle connected to a 10 cc syringe with saline and injecting and aspirating the abdominal wall for lack of return of chymus in the aspirated fluid.  This technique allowed [place] to enter a peritoneal pocket surrounded by omental and intestinal adhesions to the abdominal wall and further carried down to the right side of the pelvis where the right fallopian tube tip could be visualized.  2 more incisions were made at the umbilicus after freeing up the adhesions behind the umbilicus and also at midline at the level of the lower abdominal transverse incision. Approximately 120 minutes were spent to lyse the adhesions through the operative laparoscope with the help of the harmonic Ace and by blunt Maryland forceps dissection and most of the small bowel and omental adhesions in the left and the right lower quadrant of the abdomen were taken down.  However presence of small bowel (ileal pouch?) over the level of the uterine fundus did not allow any further visualization of the pelvic organs or tubes and ovaries.   No further attempt was made to continue the lysis of adhesions and the procedure was terminated.  The gas was allowed to escape, the instrument and the lap pad counts were correct.  2-0 Vicryl suture was placed in the subcutaneous tissue of the 10 mm left upper quadrant incision.  The incisions were approximated with Dermabond.   The patient tolerated the procedure well and was transferred to recovery room in satisfactory condition. She will be advised to her proximal tubal occlusion via Essure  or hysteroscopic rollerball occlusion of the left proximal tube such that she could go through IVF without deleterious effects of a left hydrosalpinx and embryo implantation.  Alternatively she may  need a laparotomy with the assistance of general surgery to perform a more complete adhesiolysis in the pelvis.  Fermin Schwab, MD

## 2019-08-18 NOTE — H&P (Addendum)
Caitlin Robles is a 29 y.o. female , originally referred to me by Dr. Sallye Ober, for laparoscopic lysis of adhesions, possible removal of left hydrosalpinx.  She was diagnosed with left hydrosalpinx by hysterosalpingogram and transvaginal ultrasound because of pelvic pain and infertility.  The adhesions are believed to be due to a neonatal surgery she had on her sigmoid colon.  Patient would like to preserve her childbearing potential.  Pertinent Gynecological History: Menses: flow is normal Contraception: none DES exposure: denies Blood transfusions: none Sexually transmitted diseases: no past history Previous GYN Procedures:  Last mammogram: normal Last pap: normal     Menstrual History: Menarche age: 78 No LMP recorded.    Past Medical History:  Diagnosis Date  . Chronic constipation   . History of colon resection    as infant , premature birth,  [birth year]/ 1994  . Infertility, female   . Pelvic adhesions   . Vitamin D deficiency   . Wears contact lenses                     Past Surgical History:  Procedure Laterality Date  . COLON SURGERY  02/10/93   hemicolectomy , she was premature             Family History  Problem Relation Age of Onset  . Asthma Mother   . Asthma Brother   . Diabetes Maternal Grandmother   . Hypertension Maternal Grandmother   . Diabetes Maternal Grandfather   . Hypertension Maternal Grandfather   . Diabetes Paternal Grandmother   . Hypertension Paternal Grandmother   . Diabetes Paternal Grandfather   . Heart attack Paternal Grandfather   . Hypertension Paternal Grandfather   . Healthy Father    No hereditary disease.  No cancer of breast, ovary, uterus. No cutaneous leiomyomatosis or renal cell carcinoma.  Social History   Socioeconomic History  . Marital status: Married    Spouse name: Not on file  . Number of children: Not on file  . Years of education: Not on file  . Highest education level: Not on file  Occupational History  . Not on  file  Tobacco Use  . Smoking status: Never Smoker  . Smokeless tobacco: Never Used  Substance and Sexual Activity  . Alcohol use: Not Currently  . Drug use: Never  . Sexual activity: Yes    Birth control/protection: None  Other Topics Concern  . Not on file  Social History Narrative  . Not on file   Social Determinants of Health   Financial Resource Strain:   . Difficulty of Paying Living Expenses:   Food Insecurity:   . Worried About Programme researcher, broadcasting/film/video in the Last Year:   . Barista in the Last Year:   Transportation Needs:   . Freight forwarder (Medical):   Marland Kitchen Lack of Transportation (Non-Medical):   Physical Activity:   . Days of Exercise per Week:   . Minutes of Exercise per Session:   Stress:   . Feeling of Stress :   Social Connections:   . Frequency of Communication with Friends and Family:   . Frequency of Social Gatherings with Friends and Family:   . Attends Religious Services:   . Active Member of Clubs or Organizations:   . Attends Banker Meetings:   Marland Kitchen Marital Status:   Intimate Partner Violence:   . Fear of Current or Ex-Partner:   . Emotionally Abused:   Marland Kitchen Physically Abused:   .  Sexually Abused:     No Known Allergies  No current facility-administered medications on file prior to encounter.   Current Outpatient Medications on File Prior to Encounter  Medication Sig Dispense Refill  . loratadine-pseudoephedrine (CLARITIN-D 12-HOUR) 5-120 MG tablet Take 1 tablet by mouth at bedtime.    . Multiple Vitamins-Minerals (ONE-A-DAY WOMENS PO) Take by mouth daily.       Review of Systems  Constitutional: Negative.   HENT: Negative.   Eyes: Negative.   Respiratory: Negative.   Cardiovascular: Negative.   Gastrointestinal: Negative.   Genitourinary: Negative.   Musculoskeletal: Negative.   Skin: Negative.   Neurological: Negative.   Endo/Heme/Allergies: Negative.   Psychiatric/Behavioral: Negative.      Physical Exam  BP  127/87   Pulse 89   Temp 98.4 F (36.9 C) (Oral)   Resp 16   Ht 5\' 2"  (1.575 m)   Wt 72.8 kg   SpO2 100%   BMI 29.36 kg/m  Constitutional: She is oriented to person, place, and time. She appears well-developed and well-nourished.  HENT:  Head: Normocephalic and atraumatic.  Nose: Nose normal.  Mouth/Throat: Oropharynx is clear and moist. No oropharyngeal exudate.  Eyes: Conjunctivae normal and EOM are normal. Pupils are equal, round, and reactive to light. No scleral icterus.  Neck: Normal range of motion. Neck supple. No tracheal deviation present. No thyromegaly present.  Cardiovascular: Normal rate.   Respiratory: Effort normal and breath sounds normal.  GI: Soft. Bowel sounds are normal. She exhibits no distension and no mass. There is no tenderness.  Lymphadenopathy:    She has no cervical adenopathy.  Neurological: She is alert and oriented to person, place, and time. She has normal reflexes.  Skin: Skin is warm.  Psychiatric: She has a normal mood and affect. Her behavior is normal. Judgment and thought content normal.    Assessment/Plan:  Postsurgical adhesions and left hydrosalpinx with pelvic pain and infertility Preoperative for laparoscopy, lysis of adhesions, possible removal of left hydrosalpinx Benefits and risks of the proposed procedure were discussed with the patient and her family member again.  Bowel prep instructions were given.  All of patient's questions were answered.  She verbalized understanding.

## 2019-08-18 NOTE — Discharge Instructions (Signed)
DO NOT TAKE MOTRIN/ADVIL/IBUPROFEN UNTIL AFTER 9:45 tonight.  Post Anesthesia Home Care Instructions  Activity: Get plenty of rest for the remainder of the day. A responsible adult should stay with you for 24 hours following the procedure.  For the next 24 hours, DO NOT: -Drive a car -Advertising copywriter -Drink alcoholic beverages -Take any medication unless instructed by your physician -Make any legal decisions or sign important papers.  Meals: Start with liquid foods such as gelatin or soup. Progress to regular foods as tolerated. Avoid greasy, spicy, heavy foods. If nausea and/or vomiting occur, drink only clear liquids until the nausea and/or vomiting subsides. Call your physician if vomiting continues.  Special Instructions/Symptoms: Your throat may feel dry or sore from the anesthesia or the breathing tube placed in your throat during surgery. If this causes discomfort, gargle with warm salt water. The discomfort should disappear within 24 hours.  If you had a scopolamine patch placed behind your ear for the management of post- operative nausea and/or vomiting:  1. The medication in the patch is effective for 72 hours, after which it should be removed.  Wrap patch in a tissue and discard in the trash. Wash hands thoroughly with soap and water. 2. You may remove the patch earlier than 72 hours if you experience unpleasant side effects which may include dry mouth, dizziness or visual disturbances. 3. Avoid touching the patch. Wash your hands with soap and water after contact with the patch.   DISCHARGE INSTRUCTIONS: Laparoscopy  The following instructions have been prepared to help you care for yourself upon your return home today.  Wound care: Marland Kitchen Do not get the incision wet for the first 24 hours. The incision should be kept clean and dry. . The Band-Aids or dressings may be removed the day after surgery. . Should the incision become sore, red, and swollen after the first week, check  with your doctor.  Personal hygiene: . Shower the day after your procedure.  Activity and limitations: . Do NOT drive or operate any equipment today. . Do NOT lift anything more than 15 pounds for 2-3 weeks after surgery. . Do NOT rest in bed all day. . Walking is encouraged. Walk each day, starting slowly with 5-minute walks 3 or 4 times a day. Slowly increase the length of your walks. . Walk up and down stairs slowly. . Do NOT do strenuous activities, such as golfing, playing tennis, bowling, running, biking, weight lifting, gardening, mowing, or vacuuming for 2-4 weeks. Ask your doctor when it is okay to start.  Diet: Eat a light meal as desired this evening. You may resume your usual diet tomorrow.  Return to work: This is dependent on the type of work you do. For the most part you can return to a desk job within a week of surgery. If you are more active at work, please discuss this with your doctor.  What to expect after your surgery: You may have a slight burning sensation when you urinate on the first day. You may have a very small amount of blood in the urine. Expect to have a small amount of vaginal discharge/light bleeding for 1-2 weeks. It is not unusual to have abdominal soreness and bruising for up to 2 weeks. You may be tired and need more rest for about 1 week. You may experience shoulder pain for 24-72 hours. Lying flat in bed may relieve it.  Call your doctor for any of the following: . Develop a fever of 100.4 or  greater . Inability to urinate 6 hours after discharge from hospital . Severe pain not relieved by pain medications . Persistent of heavy bleeding at incision site . Redness or swelling around incision site after a week . Increasing nausea or vomiting

## 2019-08-18 NOTE — Anesthesia Procedure Notes (Signed)
Procedure Name: Intubation Date/Time: 08/18/2019 1:31 PM Performed by: Justice Rocher, CRNA Pre-anesthesia Checklist: Patient identified, Emergency Drugs available, Suction available, Patient being monitored and Timeout performed Patient Re-evaluated:Patient Re-evaluated prior to induction Oxygen Delivery Method: Circle system utilized Preoxygenation: Pre-oxygenation with 100% oxygen Induction Type: IV induction Ventilation: Mask ventilation without difficulty Laryngoscope Size: Mac and 3 Grade View: Grade II Tube type: Oral Tube size: 7.0 mm Number of attempts: 1 Airway Equipment and Method: Stylet and Oral airway Placement Confirmation: ETT inserted through vocal cords under direct vision,  positive ETCO2,  breath sounds checked- equal and bilateral and CO2 detector Secured at: 21 cm Tube secured with: Tape Dental Injury: Teeth and Oropharynx as per pre-operative assessment

## 2019-08-20 ENCOUNTER — Encounter: Payer: Self-pay | Admitting: Dermatology

## 2019-08-20 NOTE — Progress Notes (Addendum)
   Follow-Up Visit   Subjective  Caitlin Robles is a 29 y.o. female who presents for the following: Keloid (left ear- "better? ? injection).  Keloid Location: Left ear Duration:  Quality:  Associated Signs/Symptoms: Modifying Factors: Exercise Severity:  Timing: Context: Check for residual keloid  The following portions of the chart were reviewed this encounter and updated as appropriate: Tobacco  Allergies  Meds  Problems  Med Hx  Surg Hx  Fam Hx      Objective  Well appearing patient in no apparent distress; mood and affect are within normal limits.  A focused examination was performed including Head and neck exam. Relevant physical exam findings are noted in the Assessment and Plan.   Assessment & Plan  Keloid Left Superior Helix  Intralesional injection - Left Superior Helix Location: left sup helix  Informed Consent: Discussed risks (infection, pain, bleeding, bruising, thinning of the skin, loss of skin pigment, lack of resolution, and recurrence of lesion) and benefits of the procedure, as well as the alternatives. Informed consent was obtained. Preparation: The area was prepared a standard fashion.  Anesthesia: none  Procedure Details: An intralesional injection was performed with Kenalog 40 mg/cc. 1 cc in total were injected.  Total number of injections: 1  Plan: The patient was instructed on post-op care. Recommend OTC analgesia as needed for pain.   triamcinolone acetonide (KENALOG-40) injection 40 mg - Left Superior Helix

## 2019-08-21 NOTE — Anesthesia Postprocedure Evaluation (Signed)
Anesthesia Post Note  Patient: Caitlin Robles  Procedure(s) Performed: DIAGNOSTIC LAPAROSCOPY, , LYSIS OF ADHESIONS, CHROMOPERTUBATION (Left Abdomen)     Patient location during evaluation: PACU Anesthesia Type: General Level of consciousness: awake and alert and oriented Pain management: pain level controlled Vital Signs Assessment: post-procedure vital signs reviewed and stable Respiratory status: spontaneous breathing, nonlabored ventilation and respiratory function stable Cardiovascular status: blood pressure returned to baseline Postop Assessment: no apparent nausea or vomiting Anesthetic complications: no    Last Vitals:  Vitals:   08/18/19 1700 08/18/19 1725  BP: (!) 127/92 128/82  Pulse: 66 61  Resp: (!) 9 14  Temp: (!) 36.3 C (!) 36.4 C  SpO2: 98% 98%    Pain Goal: Patients Stated Pain Goal: 6 (08/18/19 1139)                 Kaylyn Layer

## 2019-09-13 ENCOUNTER — Telehealth: Payer: 59 | Admitting: Physician Assistant

## 2019-09-13 ENCOUNTER — Other Ambulatory Visit: Payer: Self-pay

## 2019-09-15 ENCOUNTER — Ambulatory Visit: Payer: 59 | Admitting: Physician Assistant

## 2019-09-15 ENCOUNTER — Other Ambulatory Visit: Payer: Self-pay

## 2019-09-15 ENCOUNTER — Encounter: Payer: Self-pay | Admitting: Physician Assistant

## 2019-09-15 VITALS — BP 122/70 | HR 85 | Temp 98.2°F | Ht 62.0 in | Wt 163.2 lb

## 2019-09-15 DIAGNOSIS — R350 Frequency of micturition: Secondary | ICD-10-CM | POA: Diagnosis not present

## 2019-09-15 LAB — POCT URINALYSIS DIPSTICK
Bilirubin, UA: NEGATIVE
Blood, UA: NEGATIVE
Glucose, UA: NEGATIVE
Ketones, UA: NEGATIVE
Leukocytes, UA: NEGATIVE
Nitrite, UA: NEGATIVE
Protein, UA: NEGATIVE
Spec Grav, UA: 1.02 (ref 1.010–1.025)
Urobilinogen, UA: 0.2 E.U./dL
pH, UA: 6 (ref 5.0–8.0)

## 2019-09-15 LAB — POCT URINE PREGNANCY: Preg Test, Ur: NEGATIVE

## 2019-09-15 NOTE — Patient Instructions (Signed)
It was great to see you!  I will be in touch with your urine culture results.  Work on bowel movements: Start docusate sodium (colace) -- 100 mg twice daily Start miralax -- 1 capful daily  Goal is to have a medium, formed bowel movement daily. May increase Miralax by 1/2 capful every 3 days if lack of results.    Take care,  Jarold Motto PA-C

## 2019-09-15 NOTE — Progress Notes (Signed)
Caitlin Robles is a 29 y.o. female here for a new problem.  History of Present Illness:   Chief Complaint  Patient presents with  . Urinary Frequency    x 1 week    HPI   Urinary frequency Last week noticed she had some urinary frequency. When she uses the restroom she has the sensation that she may not be emptying all the way and has slight pressure on her bladder intermittently. No pain. No pressure today.   Had a procedure two week ago -- had scar tissue removed from fallopian tubes but didn't start having symptoms until about a week ago. Does have chronic constipation and had to take oxycodone a few times after surgery. Having "pellet" like stool q 3-4 days.   Denies: hematuria, rectal bleeding, nausea, fever, malaise, increased thirst/hunger  Wt Readings from Last 4 Encounters:  09/15/19 163 lb 3.2 oz (74 kg)  08/18/19 160 lb 8 oz (72.8 kg)  12/21/18 163 lb (73.9 kg)     Past Medical History:  Diagnosis Date  . Chronic constipation   . History of colon resection    as infant , premature birth,  03/ 1994  . Infertility, female   . Pelvic adhesions   . Vitamin D deficiency   . Wears contact lenses      Social History   Tobacco Use  . Smoking status: Never Smoker  . Smokeless tobacco: Never Used  Vaping Use  . Vaping Use: Never used  Substance Use Topics  . Alcohol use: Not Currently  . Drug use: Never    Past Surgical History:  Procedure Laterality Date  . COLON SURGERY  12-05-92   hemicolectomy , she was premature  . LAPAROSCOPIC UNILATERAL SALPINGECTOMY Left 08/18/2019   Procedure: DIAGNOSTIC LAPAROSCOPY, , LYSIS OF ADHESIONS, CHROMOPERTUBATION;  Surgeon: Governor Specking, MD;  Location: [facility];  Service: Gynecology;  Laterality: Left;    Family History  Problem Relation Age of Onset  . Asthma Mother   . Asthma Brother   . Diabetes Maternal Grandmother   . Hypertension Maternal Grandmother   . Diabetes Maternal Grandfather   .  Hypertension Maternal Grandfather   . Diabetes Paternal Grandmother   . Hypertension Paternal Grandmother   . Diabetes Paternal Grandfather   . Heart attack Paternal Grandfather   . Hypertension Paternal Grandfather   . Healthy Father     No Known Allergies  Current Medications:   Current Outpatient Medications:  .  loratadine-pseudoephedrine (CLARITIN-D 12-HOUR) 5-120 MG tablet, Take 1 tablet by mouth at bedtime., Disp: , Rfl:  .  Multiple Vitamins-Minerals (ONE-A-DAY WOMENS PO), Take by mouth daily., Disp: , Rfl:    Review of Systems:   ROS Negative unless otherwise specified per HPI.  Vitals:   Vitals:   09/15/19 1054  BP: 122/70  Pulse: 85  Temp: 98.2 F (36.8 C)  SpO2: 99%  Weight: 163 lb 3.2 oz (74 kg)  Height: 5\' 2"  (1.575 m)     Body mass index is 29.85 kg/m.  Physical Exam:   Physical Exam Vitals and nursing note reviewed.  Constitutional:      General: She is not in acute distress.    Appearance: She is well-developed. She is not ill-appearing or toxic-appearing.  Cardiovascular:     Rate and Rhythm: Normal rate and regular rhythm.     Pulses: Normal pulses.     Heart sounds: Normal heart sounds, S1 normal and S2 normal.     Comments: No  LE edema Pulmonary:     Effort: Pulmonary effort is normal.     Breath sounds: Normal breath sounds.  Abdominal:     General: Abdomen is flat. Bowel sounds are normal.     Palpations: Abdomen is soft.     Tenderness: There is no abdominal tenderness. There is no right CVA tenderness or left CVA tenderness.     Comments: Well healing lap scars with surgical glue  Skin:    General: Skin is warm and dry.  Neurological:     Mental Status: She is alert.     GCS: GCS eye subscore is 4. GCS verbal subscore is 5. GCS motor subscore is 6.  Psychiatric:        Speech: Speech normal.        Behavior: Behavior normal. Behavior is cooperative.     Results for orders placed or performed in visit on 09/15/19  POCT  Urinalysis Dipstick  Result Value Ref Range   Color, UA yellow    Clarity, UA clear    Glucose, UA Negative Negative   Bilirubin, UA negative    Ketones, UA negative    Spec Grav, UA 1.020 1.010 - 1.025   Blood, UA negative    pH, UA 6.0 5.0 - 8.0   Protein, UA Negative Negative   Urobilinogen, UA 0.2 0.2 or 1.0 E.U./dL   Nitrite, UA negative    Leukocytes, UA Negative Negative   Appearance     Odor    POCT urine pregnancy  Result Value Ref Range   Preg Test, Ur Negative Negative    Assessment and Plan:   Hye was seen today for urinary frequency.  Diagnoses and all orders for this visit:  Increased frequency of urination -     POCT Urinalysis Dipstick -     POCT urine pregnancy -     Urine Culture   UA and exam benign. Urine preg is negative.  I do suspect the patient is having issues with constipation and this could be contributing to her issues.  I recommended that she start daily Colace and MiraLAX until she has regular formed bowel movements.  She has follow-up with her gynecology office next week.  I will contact her with her urine culture results in the interim.  Worsening precautions advised.  . Reviewed expectations re: course of current medical issues. . Discussed self-management of symptoms. . Outlined signs and symptoms indicating need for more acute intervention. . Patient verbalized understanding and all questions were answered. . See orders for this visit as documented in the electronic medical record. . Patient received an After-Visit Summary.   Jarold Motto, PA-C

## 2019-09-17 LAB — URINE CULTURE
MICRO NUMBER:: 10581508
SPECIMEN QUALITY:: ADEQUATE

## 2019-09-18 ENCOUNTER — Encounter: Payer: Self-pay | Admitting: Physician Assistant

## 2019-09-18 ENCOUNTER — Other Ambulatory Visit: Payer: Self-pay | Admitting: Physician Assistant

## 2019-09-18 MED ORDER — NITROFURANTOIN MONOHYD MACRO 100 MG PO CAPS: 100.0000 mg | ORAL_CAPSULE | Freq: Two times a day (BID) | ORAL | 0 refills | Status: DC

## 2019-09-19 ENCOUNTER — Encounter: Payer: Self-pay | Admitting: Dermatology

## 2019-09-19 NOTE — Progress Notes (Signed)
   Follow-Up Visit   Subjective  Caitlin Robles is a 29 y.o. female who presents for the following: Procedure (Patient here today for keloid removal on left ear rim.).  Keloid Location: Left ear Duration:  Quality:  Associated Signs/Symptoms: Modifying Factors:  Severity:  Timing: Context: Patient requests removal  The following portions of the chart were reviewed this encounter and updated as appropriate:     Objective  Well appearing patient in no apparent distress; mood and affect are within normal limits.  A focused examination was performed including Head and neck.. Relevant physical exam findings are noted in the Assessment and Plan.   Assessment & Plan  Keloid Left Scaphoid Fossa  Intralesional injection - Left Scaphoid Fossa Hibiclens, lidocaine-epi infiltrative anesthesia, scissor excision with removal of >90% keloid almost to cartilage, residual injected with 40mg /cc triamcinolone.  Skin excision - Left Scaphoid Fossa  Lesion length (cm):  1.7 Lesion width (cm):  1 Margin per side (cm):  0 Total excision diameter (cm):  1.7 Informed consent: discussed and consent obtained   Timeout: patient name, date of birth, surgical site, and procedure verified   Procedure prep:  Patient was prepped and draped in usual sterile fashion Prep type:  Chlorhexidine Anesthesia: the lesion was anesthetized in a standard fashion   Anesthetic:  1% lidocaine w/ epinephrine 1-100,000 local infiltration Instrument used: scissors   Hemostasis achieved with: pressure and ferric subsulfate   Outcome: patient tolerated procedure well with no complications   Post-procedure details: sterile dressing applied and wound care instructions given   Dressing type: petrolatum and bandage    Specimen 1 - Surgical pathology Differential Diagnosis: r/o keloid Check Margins: No

## 2019-09-22 ENCOUNTER — Ambulatory Visit: Payer: 59 | Admitting: Physician Assistant

## 2019-09-25 DIAGNOSIS — F411 Generalized anxiety disorder: Secondary | ICD-10-CM | POA: Diagnosis not present

## 2019-09-25 DIAGNOSIS — Z630 Problems in relationship with spouse or partner: Secondary | ICD-10-CM | POA: Diagnosis not present

## 2019-10-18 ENCOUNTER — Ambulatory Visit: Payer: 59 | Admitting: Dermatology

## 2019-11-17 ENCOUNTER — Encounter: Payer: Self-pay | Admitting: Physician Assistant

## 2019-11-17 ENCOUNTER — Ambulatory Visit: Payer: 59 | Admitting: Physician Assistant

## 2019-11-17 ENCOUNTER — Other Ambulatory Visit: Payer: Self-pay

## 2019-11-17 VITALS — BP 128/88 | HR 74 | Temp 98.6°F | Ht 62.0 in | Wt 162.8 lb

## 2019-11-17 DIAGNOSIS — Z0001 Encounter for general adult medical examination with abnormal findings: Secondary | ICD-10-CM | POA: Diagnosis not present

## 2019-11-17 DIAGNOSIS — Z136 Encounter for screening for cardiovascular disorders: Secondary | ICD-10-CM | POA: Diagnosis not present

## 2019-11-17 DIAGNOSIS — Z13220 Encounter for screening for lipoid disorders: Secondary | ICD-10-CM

## 2019-11-17 DIAGNOSIS — N971 Female infertility of tubal origin: Secondary | ICD-10-CM

## 2019-11-17 DIAGNOSIS — Z9049 Acquired absence of other specified parts of digestive tract: Secondary | ICD-10-CM | POA: Insufficient documentation

## 2019-11-17 DIAGNOSIS — R5383 Other fatigue: Secondary | ICD-10-CM

## 2019-11-17 DIAGNOSIS — N979 Female infertility, unspecified: Secondary | ICD-10-CM | POA: Insufficient documentation

## 2019-11-17 DIAGNOSIS — E663 Overweight: Secondary | ICD-10-CM

## 2019-11-17 NOTE — Progress Notes (Signed)
I acted as a Neurosurgeon for [employer], PA-C [place], [place]  Subjective:    Caitlin Robles is a 29 y.o. female and is here for a comprehensive physical exam.  HPI  Health Maintenance Due  Topic Date Due  . INFLUENZA VACCINE  11/05/2019    Acute Concerns: Fatigue -- Pt c/o of feeling tired all of the time. She gets around 7 hours of sleep. She works 7-5 x 4 days a week. Often picks up shifts on the other day of the week. She hasn't been exercising due to "laziness". She denies underlying anxiety/depression. Limited alcohol intake and caffeine. Does endorse unhealthy diet. Denies: unintentional weight loss, unusual abdominal pain/rectal bleeding, night sweats, heavy menses.  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 April Robles in May 2021. She has follow-up soon in September.   Health Maintenance: Immunizations -- UTD -- got her first covid vaccine  Colonoscopy -- N/A Mammogram -- N/A PAP -- UTD Bone Density -- N/A Diet -- high fat foods; drinks more water than  Caffeine intake -- minimal caffeine Sleep habits -- 6-7 hours a night Exercise -- none due to laziness Current Weight -- Weight: 162 lb 12.8 oz (73.8 kg) -- more comfortable around 140 lb Weight History: Wt Readings from Last 10 Encounters:  11/17/19 162 lb 12.8 oz (73.8 kg)  09/15/19 163 lb 3.2 oz (74 kg)  08/18/19 160 lb 8 oz (72.8 kg)  12/21/18 163 lb (73.9 kg)   Body mass index is 29.78 kg/m. Mood -- pretty stable Patient's last menstrual period was 10/30/2019. Period characteristics -- normal per patient Birth control -- none  Depression screen PHQ 2/9 12/21/2018  Decreased Interest 0  Down, Depressed, Hopeless 0  PHQ - 2 Score 0     Other providers/specialists: Patient Care Team: Caitlin Robles, [place] as PCP - General (Physician Assistant) Caitlin Harder, MD as Consulting Physician (Dermatology)   PMHx, SurgHx, SocialHx, Medications, and  Allergies were reviewed in the Visit Navigator and updated as appropriate.   Past Medical History:  Diagnosis Date  . Chronic constipation   . History of colon resection    as infant , premature birth,  03/ 1994  . Infertility, female   . Pelvic adhesions   . Vitamin D deficiency      Past Surgical History:  Procedure Laterality Date  . COLON SURGERY  Dec 01, 1992   hemicolectomy , she was premature  . LAPAROSCOPIC UNILATERAL SALPINGECTOMY Left 08/18/2019   Procedure: DIAGNOSTIC LAPAROSCOPY, , LYSIS OF ADHESIONS, CHROMOPERTUBATION;  Surgeon: Caitlin Schwab, MD;  Location[facility];  Service: Gynecology;  Laterality: Left;     Family History  Problem Relation Age of Onset  . Healthy Mother   . Asthma Brother   . Diabetes Maternal Grandmother   . Hypertension Maternal Grandmother   . Breast cancer Maternal Grandmother   . Thyroid disease Maternal Grandmother   . Diabetes Maternal Grandfather   . Hypertension Maternal Grandfather   . Diabetes Paternal Grandmother   . Hypertension Paternal Grandmother   . Diabetes Paternal Grandfather   . Heart attack Paternal Grandfather   . Hypertension Paternal Grandfather   . Asthma Father   . Hypertension Father     Social History   Tobacco Use  . Smoking status: Never Smoker  . Smokeless tobacco: Never Used  Vaping Use  . Vaping Use: Never used  Substance Use Topics  . Alcohol use: Yes    Alcohol/week:  2.0 standard drinks    Types: 2 Glasses of wine per week  . Drug use: Never    Review of Systems:   ROS  Objective:   BP 128/88 (BP Location: Left Arm, Patient Position: Sitting, Cuff Size: Normal)   Pulse 74   Temp 98.6 F (37 C) (Temporal)   Ht 5\' 2"  (1.575 m)   Wt 162 lb 12.8 oz (73.8 kg)   LMP 10/30/2019   SpO2 99%   BMI 29.78 kg/m   General Appearance:    Alert, cooperative, no distress, appears stated age  Head:    Normocephalic, without obvious abnormality, atraumatic  Eyes:    PERRL,  conjunctiva/corneas clear, EOM's intact, fundi    benign, both eyes  Ears:    Normal TM's and external ear canals, both ears  Nose:   Nares normal, septum midline, mucosa normal, no drainage    or sinus tenderness  Throat:   Lips, mucosa, and tongue normal; teeth and gums normal  Neck:   Supple, symmetrical, trachea midline, no adenopathy;    thyroid:  no enlargement/tenderness/nodules; no carotid   bruit or JVD  Back:     Symmetric, no curvature, ROM normal, no CVA tenderness  Lungs:     Clear to auscultation bilaterally, respirations unlabored  Chest Wall:    No tenderness or deformity   Heart:    Regular rate and rhythm, S1 and S2 normal, no murmur, rub   or gallop  Breast Exam:    Deferred  Abdomen:     Soft, non-tender, bowel sounds active all four quadrants,    no masses, no organomegaly  Genitalia:    Deferred  Rectal:    Deferred  Extremities:   Extremities normal, atraumatic, no cyanosis or edema  Pulses:   2+ and symmetric all extremities  Skin:   Skin color, texture, turgor normal, no rashes or lesions  Lymph nodes:   Cervical, supraclavicular, and axillary nodes normal  Neurologic:   CNII-XII intact, normal strength, sensation and reflexes    throughout     Assessment/Plan:   Caitlin Robles was seen today for annual exam.  Diagnoses and all orders for this visit:  Encounter for general adult medical examination with abnormal findings Today patient counseled on age appropriate routine health concerns for screening and prevention, each reviewed and up to date or declined. Immunizations reviewed and up to date or declined. Labs ordered and reviewed. Risk factors for depression reviewed and negative. Hearing function and visual acuity are intact. ADLs screened and addressed as needed. Functional ability and level of safety reviewed and appropriate. Education, counseling and referrals performed based on assessed risks today. Patient provided with a copy of personalized plan for  preventive services.  Fatigue, unspecified type Unclear etiology. Update labs today to r/o organic cause. Encouraged healthy lifestyle attempts with eating/exercise. Follow-up based on results and clinical symptoms. -     CBC w/Diff; Future -     Comprehensive metabolic panel; Future -     TSH; Future -     Vitamin B12; Future -     Ferritin; Future -     Ferritin -     Vitamin B12 -     TSH -     Comprehensive metabolic panel -     CBC w/Diff  Overweight Encouraged healthy lifestyle attempts with eating/exercise. 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  Encounter for lipid screening for cardiovascular disease -     Lipid panel; Future -     Lipid panel   Well Adult Exam: Labs ordered: Yes. Patient counseling was done. See below for items discussed. Discussed the patient's BMI. The BMI is not in the acceptable range; BMI management plan is completed Follow up in one year.  Patient Counseling:   [x]     Nutrition: Stressed importance of moderation in sodium/caffeine intake, saturated fat and cholesterol, caloric balance, sufficient intake of fresh fruits, vegetables, fiber, calcium, iron, and 1 mg of folate supplement per day (for females capable of pregnancy).   [x]      Stressed the importance of regular exercise.    [x]     Substance Abuse: Discussed cessation/primary prevention of tobacco, alcohol, or other drug use; driving or other dangerous activities under the influence; availability of treatment for abuse.    [x]      Injury prevention: Discussed safety belts, safety helmets, smoke detector, smoking near bedding or upholstery.    [x]      Sexuality: Discussed sexually transmitted diseases, partner selection, use of condoms, avoidance of unintended  pregnancy  and contraceptive alternatives.    [x]     Dental health: Discussed importance of regular tooth brushing, flossing, and dental visits.   [x]      Health maintenance and immunizations reviewed. Please refer to Health maintenance section.   CMA or LPN served as scribe during this visit. History, Physical, and Plan performed by medical provider. The above documentation has been reviewed and is accurate and complete.  , PA-C [place] [facility]

## 2019-11-17 NOTE — Patient Instructions (Addendum)
It was great to see you!  This is the recommendation from your operative note:  "She will be advised to her proximal tubal occlusion via Essure or hysteroscopic rollerball occlusion of the left proximal tube such that she could go through IVF without deleterious effects of a left hydrosalpinx and embryo implantation.  Alternatively she may need a laparotomy with the assistance of general surgery to perform a more complete adhesiolysis in the pelvis."  Potential question for your doctor --> If I have another surgery, what is the chance of accumulating even more scar tissue?    Health Maintenance, Female Adopting a healthy lifestyle and getting preventive care are important in promoting health and wellness. Ask your health care provider about:  The right schedule for you to have regular tests and exams.  Things you can do on your own to prevent diseases and keep yourself healthy. What should I know about diet, weight, and exercise? Eat a healthy diet   Eat a diet that includes plenty of vegetables, fruits, low-fat dairy products, and lean protein.  Do not eat a lot of foods that are high in solid fats, added sugars, or sodium. Maintain a healthy weight Body mass index (BMI) is used to identify weight problems. It estimates body fat based on height and weight. Your health care provider can help determine your BMI and help you achieve or maintain a healthy weight. Get regular exercise Get regular exercise. This is one of the most important things you can do for your health. Most adults should:  Exercise for at least 150 minutes each week. The exercise should increase your heart rate and make you sweat (moderate-intensity exercise).  Do strengthening exercises at least twice a week. This is in addition to the moderate-intensity exercise.  Spend less time sitting. Even light physical activity can be beneficial. Watch cholesterol and blood lipids Have your blood tested for lipids and  cholesterol at 27 years of age, then have this test every 5 years. Have your cholesterol levels checked more often if:  Your lipid or cholesterol levels are high.  You are older than 27 years of age.  You are at high risk for heart disease. What should I know about cancer screening? Depending on your health history and family history, you may need to have cancer screening at various ages. This may include screening for:  Breast cancer.  Cervical cancer.  Colorectal cancer.  Skin cancer.  Lung cancer. What should I know about heart disease, diabetes, and high blood pressure? Blood pressure and heart disease  High blood pressure causes heart disease and increases the risk of stroke. This is more likely to develop in people who have high blood pressure readings, are of African descent, or are overweight.  Have your blood pressure checked: ? Every 3-5 years if you are 10-12 years of age. ? Every year if you are 79 years old or older. Diabetes Have regular diabetes screenings. This checks your fasting blood sugar level. Have the screening done:  Once every three years after age 69 if you are at a normal weight and have a low risk for diabetes.  More often and at a younger age if you are overweight or have a high risk for diabetes. What should I know about preventing infection? Hepatitis B If you have a higher risk for hepatitis B, you should be screened for this virus. Talk with your health care provider to find out if you are at risk for hepatitis B infection. Hepatitis C  Testing is recommended for:  Everyone born from [birth year] through 1963.  Anyone with known risk factors for hepatitis C. Sexually transmitted infections (STIs)  Get screened for STIs, including gonorrhea and chlamydia, if: ? You are sexually active and are younger than 27 years of age. ? You are older than 27 years of age and your health care provider tells you that you are at risk for this type of  infection. ? Your sexual activity has changed since you were last screened, and you are at increased risk for chlamydia or gonorrhea. Ask your health care provider if you are at risk.  Ask your health care provider about whether you are at high risk for HIV. Your health care provider may recommend a prescription medicine to help prevent HIV infection. If you choose to take medicine to prevent HIV, you should first get tested for HIV. You should then be tested every 3 months for as long as you are taking the medicine. Pregnancy  If you are about to stop having your period (premenopausal) and you may become pregnant, seek counseling before you get pregnant.  Take 400 to 800 micrograms (mcg) of folic acid every day if you become pregnant.  Ask for birth control (contraception) if you want to prevent pregnancy. Osteoporosis and menopause Osteoporosis is a disease in which the bones lose minerals and strength with aging. This can result in bone fractures. If you are 21 years old or older, or if you are at risk for osteoporosis and fractures, ask your health care provider if you should:  Be screened for bone loss.  Take a calcium or vitamin D supplement to lower your risk of fractures.  Be given hormone replacement therapy (HRT) to treat symptoms of menopause. Follow these instructions at home: Lifestyle  Do not use any products that contain nicotine or tobacco, such as cigarettes, e-cigarettes, and chewing tobacco. If you need help quitting, ask your health care provider.  Do not use street drugs.  Do not share needles.  Ask your health care provider for help if you need support or information about quitting drugs. Alcohol use  Do not drink alcohol if: ? Your health care provider tells you not to drink. ? You are pregnant, may be pregnant, or are planning to become pregnant.  If you drink alcohol: ? Limit how much you use to 0-1 drink a day. ? Limit intake if you are  breastfeeding.  Be aware of how much alcohol is in your drink. In the [place] one drink equals one 12 oz bottle of beer (355 mL), one 5 oz glass of wine (148 mL), or one 1 oz glass of hard liquor (44 mL). General instructions  Schedule regular health, dental, and eye exams.  Stay current with your vaccines.  Tell your health care provider if: ? You often feel depressed. ? You have ever been abused or do not feel safe at home. Summary  Adopting a healthy lifestyle and getting preventive care are important in promoting health and wellness.  Follow your health care provider's instructions about healthy diet, exercising, and getting tested or screened for diseases.  Follow your health care provider's instructions on monitoring your cholesterol and blood pressure. This information is not intended to replace advice given to you by your health care provider. Make sure you discuss any questions you have with your health care provider. Document Revised: 03/16/2018 Document Reviewed: 03/16/2018 Elsevier Patient Education  2020 [employer].

## 2019-11-18 LAB — HEMOGLOBIN A1C
Hgb A1c MFr Bld: 4.6 % of total Hgb (ref <5.7)
Mean Plasma Glucose: 85 (calc)
eAG (mmol/L): 4.7 (calc)

## 2019-11-18 LAB — COMPREHENSIVE METABOLIC PANEL
AG Ratio: 1.6 (calc) (ref 1.0–2.5)
ALT: 14 U/L (ref 6–29)
AST: 13 U/L (ref 10–30)
Albumin: 4.9 g/dL (ref 3.6–5.1)
Alkaline phosphatase (APISO): 76 U/L (ref 31–125)
BUN: 8 mg/dL (ref 7–25)
CO2: 25 mmol/L (ref 20–32)
Calcium: 10.2 mg/dL (ref 8.6–10.2)
Chloride: 102 mmol/L (ref 98–110)
Creat: 0.92 mg/dL (ref 0.50–1.10)
Globulin: 3 g/dL (calc) (ref 1.9–3.7)
Glucose, Bld: 67 mg/dL (ref 65–99)
Potassium: 3.9 mmol/L (ref 3.5–5.3)
Sodium: 137 mmol/L (ref 135–146)
Total Bilirubin: 0.6 mg/dL (ref 0.2–1.2)
Total Protein: 7.9 g/dL (ref 6.1–8.1)

## 2019-11-18 LAB — LIPID PANEL
Cholesterol: 135 mg/dL (ref <200)
HDL: 48 mg/dL — ABNORMAL LOW (ref >=50)
LDL Cholesterol (Calc): 70 mg/dL (calc)
Non-HDL Cholesterol (Calc): 87 mg/dL (calc) (ref <130)
Total CHOL/HDL Ratio: 2.8 (calc) (ref <5.0)
Triglycerides: 83 mg/dL (ref <150)

## 2019-11-18 LAB — CBC WITH DIFFERENTIAL/PLATELET
Absolute Monocytes: 488 cells/uL (ref 200–950)
Basophils Absolute: 39 cells/uL (ref 0–200)
Basophils Relative: 0.6 %
Eosinophils Absolute: 163 cells/uL (ref 15–500)
Eosinophils Relative: 2.5 %
HCT: 40.6 % (ref 35.0–45.0)
Hemoglobin: 13.4 g/dL (ref 11.7–15.5)
Lymphs Abs: 2789 cells/uL (ref 850–3900)
MCH: 31.2 pg (ref 27.0–33.0)
MCHC: 33 g/dL (ref 32.0–36.0)
MCV: 94.4 fL (ref 80.0–100.0)
MPV: 8.9 fL (ref 7.5–12.5)
Monocytes Relative: 7.5 %
Neutro Abs: 3023 cells/uL (ref 1500–7800)
Neutrophils Relative %: 46.5 %
Platelets: 334 10*3/uL (ref 140–400)
RBC: 4.3 10*6/uL (ref 3.80–5.10)
RDW: 11 % (ref 11.0–15.0)
Total Lymphocyte: 42.9 %
WBC: 6.5 10*3/uL (ref 3.8–10.8)

## 2019-11-18 LAB — TSH: TSH: 2.2 mIU/L

## 2019-11-18 LAB — VITAMIN B12: Vitamin B-12: >2000 pg/mL — ABNORMAL HIGH (ref 200–1100)

## 2019-11-18 LAB — FERRITIN: Ferritin: 86 ng/mL (ref 16–154)

## 2019-12-18 ENCOUNTER — Ambulatory Visit (INDEPENDENT_AMBULATORY_CARE_PROVIDER_SITE_OTHER): Payer: 59 | Admitting: Dermatology

## 2019-12-18 ENCOUNTER — Encounter: Payer: Self-pay | Admitting: Dermatology

## 2019-12-18 ENCOUNTER — Other Ambulatory Visit: Payer: Self-pay

## 2019-12-18 DIAGNOSIS — L910 Hypertrophic scar: Secondary | ICD-10-CM

## 2019-12-18 MED ORDER — TRIAMCINOLONE ACETONIDE 40 MG/ML IJ SUSP
40.0000 mg | Freq: Once | INTRAMUSCULAR | Status: AC
  Administered 2019-12-18: 40 mg

## 2020-01-03 DIAGNOSIS — N736 Female pelvic peritoneal adhesions (postinfective): Secondary | ICD-10-CM | POA: Diagnosis not present

## 2020-01-03 DIAGNOSIS — N7011 Chronic salpingitis: Secondary | ICD-10-CM | POA: Diagnosis not present

## 2020-01-14 ENCOUNTER — Encounter: Payer: Self-pay | Admitting: Dermatology

## 2020-01-14 MED ORDER — TRIAMCINOLONE ACETONIDE 40 MG/ML IJ SUSP: 40.0000 mg | Freq: Once | INTRAMUSCULAR | Status: DC

## 2020-01-14 NOTE — Progress Notes (Signed)
   Follow-Up Visit   Subjective  Caitlin Robles is a 29 y.o. female who presents for the following: Follow-up (keliod left ear).  Keloid Location: Ear Duration:  Quality:  Associated Signs/Symptoms: Modifying Factors: Surgical excision followed by sequence of intralesional triamcinolone helpful Severity:  Timing: Context:   Objective  Well appearing patient in no apparent distress; mood and affect are within normal limits.  A focused examination was performed including Head and neck.. Relevant physical exam findings are noted in the Assessment and Plan.   Assessment & Plan    Keloid Left Ear  Keloid injected with triamcinolone 40 mg/cc 0.1 cc 2 injections.  Well-tolerated.     I, [name], MD, have reviewed all documentation for this visit.  The documentation on 01/14/20 for the exam, diagnosis, procedures, and orders are all accurate and complete.

## 2020-02-26 DIAGNOSIS — N979 Female infertility, unspecified: Secondary | ICD-10-CM | POA: Diagnosis not present

## 2020-02-26 DIAGNOSIS — Z01419 Encounter for gynecological examination (general) (routine) without abnormal findings: Secondary | ICD-10-CM | POA: Diagnosis not present

## 2020-04-18 ENCOUNTER — Telehealth: Payer: Self-pay

## 2020-04-18 NOTE — Telephone Encounter (Signed)
[facility] Night - Client Nonclinical Telephone Record AccessNurse Client [place] [facility] Night - Client Client Site  [facility] [place] - Night Contact Type Call Who Is Calling Patient / Member / Family / Caregiver Caller Name Simona Rocque Caller Phone Number [phone] Patient Name Genelle Economou Call Type Message Only Information Provided Reason for Call Request for General Office Information Initial Comment Caller states helping out [place] today[place]. Calling to let Irving Burton know. Called backline/no answer; Disp. Time Disposition Final User 04/18/2020 8:01:29 AM General Information Provided Yes Albin Fischer Call Closed By: Albin Fischer Transaction Date/Time: 04/18/2020 7:57:10 AM (ET)

## 2020-08-14 ENCOUNTER — Ambulatory Visit: Payer: 59 | Admitting: Dermatology

## 2020-09-19 ENCOUNTER — Ambulatory Visit (INDEPENDENT_AMBULATORY_CARE_PROVIDER_SITE_OTHER): Payer: 59 | Admitting: Dermatology

## 2020-09-19 ENCOUNTER — Other Ambulatory Visit: Payer: Self-pay

## 2020-09-19 ENCOUNTER — Encounter: Payer: Self-pay | Admitting: Dermatology

## 2020-09-19 DIAGNOSIS — L910 Hypertrophic scar: Secondary | ICD-10-CM | POA: Diagnosis not present

## 2020-09-19 MED ORDER — TRIAMCINOLONE ACETONIDE 10 MG/ML IJ SUSP
10.0000 mg | Freq: Once | INTRAMUSCULAR | Status: AC
  Administered 2020-09-19: 10 mg

## 2020-09-27 ENCOUNTER — Ambulatory Visit: Payer: 59 | Admitting: Family Medicine

## 2020-09-27 DIAGNOSIS — Z0289 Encounter for other administrative examinations: Secondary | ICD-10-CM

## 2020-09-27 NOTE — Progress Notes (Incomplete)
   Caitlin Robles is a 30 y.o. female who presents today for an office visit.  Assessment/Plan:  New/Acute Problems:   Chronic Problems Addressed Today: No problem-specific Assessment & Plan notes found for this encounter.     Subjective:  HPI:        Objective:  Physical Exam: There were no vitals taken for this visit.  Gen: No acute distress, resting comfortably*** CV: Regular rate and rhythm with no murmurs appreciated Pulm: Normal work of breathing, clear to auscultation bilaterally with no crackles, wheezes, or rhonchi Neuro: Grossly normal, moves all extremities Psych: Normal affect and thought content      I,Jordan Kelly,acting as a scribe for Jacquiline Doe, MD.,have documented all relevant documentation on the behalf of Jacquiline Doe, MD,as directed by  Jacquiline Doe, MD while in the presence of Jacquiline Doe, MD. ***  Katina Degree. Jimmey Ralph, MD 09/27/2020 7:32 AM

## 2020-09-28 ENCOUNTER — Encounter: Payer: Self-pay | Admitting: Dermatology

## 2020-09-28 NOTE — Progress Notes (Signed)
   Follow-Up Visit   Subjective  Caitlin Robles is a 30 y.o. female who presents for the following: Skin Problem (Patient here today for two bumps on the left side of her face x 1 year, no itching, no pain, and no bleeding. No personal history or family history of atypical moles or non mole skin cancer. ).  Keloids on face Location:  Duration:  Quality:  Associated Signs/Symptoms: Modifying Factors:  Severity:  Timing: Context:   Objective  Well appearing patient in no apparent distress; mood and affect are within normal limits. Left Parotid Area (2) Firm pink dermal 46mm papules c/w keloid.  Cannot absolutely rule out underlying cyst.       A focused examination was performed including head and neck. Relevant physical exam findings are noted in the Assessment and Plan.   Assessment & Plan    Keloid (2) Left Parotid Area  Intralesional injection - Left Parotid Area (2) Location: left jawline  Informed Consent: Discussed risks (infection, pain, bleeding, bruising, thinning of the skin, loss of skin pigment, lack of resolution, and recurrence of lesion) and benefits of the procedure, as well as the alternatives. Informed consent was obtained. Preparation: The area was prepared a standard fashion.  Anesthesia:none  Procedure Details: An intralesional injection was performed with Kenalog 10 mg/cc. 0.1 cc in total were injected.  Total number of injections: 2  Plan: The patient was instructed on post-op care. Recommend OTC analgesia as needed for pain.   triamcinolone acetonide (KENALOG) 10 MG/ML injection 10 mg - Left Parotid Area (2)       I, [name], MD, have reviewed all documentation for this visit.  The documentation on 09/28/20 for the exam, diagnosis, procedures, and orders are all accurate and complete.

## 2020-10-02 ENCOUNTER — Encounter: Payer: Self-pay | Admitting: Physician Assistant

## 2020-11-15 ENCOUNTER — Other Ambulatory Visit: Payer: Self-pay

## 2020-11-15 ENCOUNTER — Ambulatory Visit: Payer: 59 | Admitting: Physician Assistant

## 2020-11-15 ENCOUNTER — Encounter: Payer: Self-pay | Admitting: Physician Assistant

## 2020-11-15 ENCOUNTER — Other Ambulatory Visit (HOSPITAL_COMMUNITY)
Admission: RE | Admit: 2020-11-15 | Discharge: 2020-11-15 | Disposition: A | Discharge status: Home / Self Care | Payer: 59 | Source: Ambulatory Visit | Attending: Physician Assistant | Admitting: Physician Assistant

## 2020-11-15 VITALS — BP 119/85 | HR 94 | Temp 98.2°F | Ht 62.0 in | Wt 157.2 lb

## 2020-11-15 DIAGNOSIS — N760 Acute vaginitis: Secondary | ICD-10-CM

## 2020-11-15 DIAGNOSIS — Z124 Encounter for screening for malignant neoplasm of cervix: Secondary | ICD-10-CM | POA: Diagnosis not present

## 2020-11-15 NOTE — Progress Notes (Signed)
Acute Office Visit  Subjective:    Patient ID: Caitlin Robles, female    DOB: [birth year], 30 y.o.   MRN: [id number]  Chief Complaint  Patient presents with   Vaginal Discharge    Vaginal Discharge The patient's primary symptoms include vaginal discharge.  Patient is in today for vaginal discharge and discomfort x 1-2 weeks. More of a yellowed color with some odor. She is sexually active, no new partners though. States she had trich a long time ago.  LMP 10/31/20, normal cycle for her.  Denies any abdominal pain, no fever or chills, back pain, no dysuria or frequency.   Past Medical History:  Diagnosis Date   Chronic constipation    History of colon resection    as infant , premature birth,  [birth year]/ 41   Infertility, female    Pelvic adhesions    Vitamin D deficiency     Past Surgical History:  Procedure Laterality Date   COLON SURGERY  04/28/1992   hemicolectomy , she was premature   LAPAROSCOPIC UNILATERAL SALPINGECTOMY Left 08/18/2019   Procedure: DIAGNOSTIC LAPAROSCOPY, , LYSIS OF ADHESIONS, CHROMOPERTUBATION;  Surgeon: Fermin Schwab, MD;  Location: [facility];  Service: Gynecology;  Laterality: Left;    Family History  Problem Relation Age of Onset   Healthy Mother    Asthma Brother    Diabetes Maternal Grandmother    Hypertension Maternal Grandmother    Breast cancer Maternal Grandmother    Thyroid disease Maternal Grandmother    Diabetes Maternal Grandfather    Hypertension Maternal Grandfather    Diabetes Paternal Grandmother    Hypertension Paternal Grandmother    Diabetes Paternal Grandfather    Heart attack Paternal Grandfather    Hypertension Paternal Grandfather    Asthma Father    Hypertension Father     Social History   Socioeconomic History   Marital status: Married    Spouse name: Not on file   Number of children: Not on file   Years of education: Not on file   Highest education level: Not on file  Occupational History   Not  on file  Tobacco Use   Smoking status: Never   Smokeless tobacco: Never  Vaping Use   Vaping Use: Never used  Substance and Sexual Activity   Alcohol use: Yes    Alcohol/week: 2.0 standard drinks    Types: 2 Glasses of wine per week   Drug use: Never   Sexual activity: Yes    Birth control/protection: None  Other Topics Concern   Not on file  Social History Narrative   Not on file   Social Determinants of Health   Financial Resource Strain: Not on file  Food Insecurity: Not on file  Transportation Needs: Not on file  Physical Activity: Not on file  Stress: Not on file  Social Connections: Not on file  Intimate Partner Violence: Not on file    Outpatient Medications Prior to Visit  Medication Sig Dispense Refill   loratadine-pseudoephedrine (CLARITIN-D 12-HOUR) 5-120 MG tablet Take 1 tablet by mouth at bedtime.     Multiple Vitamins-Minerals (ONE-A-DAY WOMENS PO) Take by mouth daily.     No facility-administered medications prior to visit.    No Known Allergies  Review of Systems  Genitourinary:  Positive for vaginal discharge.  REFER TO HPI FOR PERTINENT POSITIVES AND NEGATIVES     Objective:    Physical Exam Vitals reviewed. Exam conducted with a chaperone present.  Genitourinary:    General:  Normal vulva.     Pubic Area: No rash.      Labia:        Right: No rash.      Vagina: Vaginal discharge (white thin discharge) present.     Cervix: Normal.     Uterus: Normal.      Adnexa: Right adnexa normal and left adnexa normal.  Lymphadenopathy:     Lower Body: No right inguinal adenopathy. No left inguinal adenopathy.    BP 119/85   Pulse 94   Temp 98.2 F (36.8 C)   Ht 5\' 2"  (1.575 m)   Wt 157 lb 3.2 oz (71.3 kg)   SpO2 98%   BMI 28.75 kg/m  Wt Readings from Last 3 Encounters:  11/15/20 157 lb 3.2 oz (71.3 kg)  11/17/19 162 lb 12.8 oz (73.8 kg)  09/15/19 163 lb 3.2 oz (74 kg)    Health Maintenance Due  Topic Date Due   INFLUENZA VACCINE   11/04/2020    There are no preventive care reminders to display for this patient.   Lab Results  Component Value Date   TSH 2.20 11/17/2019   Lab Results  Component Value Date   WBC 6.5 11/17/2019   HGB 13.4 11/17/2019   HCT 40.6 11/17/2019   MCV 94.4 11/17/2019   PLT 334 11/17/2019   Lab Results  Component Value Date   NA 137 11/17/2019   K 3.9 11/17/2019   CO2 25 11/17/2019   GLUCOSE 67 11/17/2019   BUN 8 11/17/2019   CREATININE 0.92 11/17/2019   BILITOT 0.6 11/17/2019   ALKPHOS 79 12/21/2018   AST 13 11/17/2019   ALT 14 11/17/2019   PROT 7.9 11/17/2019   ALBUMIN 4.4 12/21/2018   CALCIUM 10.2 11/17/2019   GFR 96.12 12/21/2018   Lab Results  Component Value Date   CHOL 135 11/17/2019   Lab Results  Component Value Date   HDL 48 (L) 11/17/2019   Lab Results  Component Value Date   LDLCALC 70 11/17/2019   Lab Results  Component Value Date   TRIG 83 11/17/2019   Lab Results  Component Value Date   CHOLHDL 2.8 11/17/2019   Lab Results  Component Value Date   HGBA1C 4.6 11/17/2019       Assessment & Plan:   Problem List Items Addressed This Visit   None Visit Diagnoses     Acute vaginitis    -  Primary   Relevant Orders   Cervicovaginal ancillary only   Encounter for Papanicolaou smear for cervical cancer screening       Relevant Orders   Cytology - PAP      1. Acute vaginitis 2. Encounter for Papanicolaou smear for cervical cancer screening Pelvic exam performed in office with chaperone. Swab for vaginitis and also pap smear completed. Will treat according to results.    Kasen Adduci M Maryfer Tauzin, PA-C

## 2020-11-15 NOTE — Patient Instructions (Signed)
Good to meet you!  Updated pap smear today. Will call with results of swab and pap & treat accordingly.  Continue to drink plenty of fluids.

## 2020-11-18 LAB — CERVICOVAGINAL ANCILLARY ONLY
Bacterial Vaginitis (gardnerella): POSITIVE — AB
Candida Glabrata: NEGATIVE
Candida Vaginitis: NEGATIVE
Chlamydia: NEGATIVE
Comment: NEGATIVE
Comment: NEGATIVE
Comment: NEGATIVE
Comment: NEGATIVE
Comment: NEGATIVE
Comment: NORMAL
Neisseria Gonorrhea: NEGATIVE
Trichomonas: NEGATIVE

## 2020-11-19 ENCOUNTER — Other Ambulatory Visit: Payer: Self-pay

## 2020-11-19 DIAGNOSIS — N979 Female infertility, unspecified: Secondary | ICD-10-CM | POA: Diagnosis not present

## 2020-11-19 MED ORDER — METRONIDAZOLE 500 MG PO TABS
500.0000 mg | ORAL_TABLET | Freq: Two times a day (BID) | ORAL | 0 refills | Status: AC
Start: 2020-11-19 — End: 2020-11-26

## 2020-11-20 LAB — CYTOLOGY - PAP
Comment: NEGATIVE
Diagnosis: NEGATIVE
High risk HPV: NEGATIVE

## 2020-11-29 ENCOUNTER — Encounter: Payer: 59 | Admitting: Physician Assistant

## 2020-11-29 NOTE — Progress Notes (Deleted)
I acted as a Neurosurgeon for [employer], PA-C Corky Mull, LPN   Subjective:    Caitlin Robles is a 30 y.o. female and is here for a comprehensive physical exam.  HPI  Health Maintenance Due  Topic Date Due   INFLUENZA VACCINE  11/04/2020    Acute Concerns:   Chronic Issues:   Health Maintenance: Immunizations -- UTD Colonoscopy -- N/A Mammogram -- N/A PAP -- UTD, due 8/25 Bone Density -- N/A Diet -- *** Caffeine intake -- *** Sleep habits -- *** Exercise -- *** Current Weight --    Weight History: Wt Readings from Last 10 Encounters:  11/15/20 157 lb 3.2 oz (71.3 kg)  11/17/19 162 lb 12.8 oz (73.8 kg)  09/15/19 163 lb 3.2 oz (74 kg)  08/18/19 160 lb 8 oz (72.8 kg)  12/21/18 163 lb (73.9 kg)   There is no height or weight on file to calculate BMI. Mood -- ***  No LMP recorded. Period characteristics -- *** Birth control -- ***    reports current alcohol use of about 2.0 standard drinks per week.  Tobacco Use: Low Risk    Smoking Tobacco Use: Never   Smokeless Tobacco Use: Never     Depression screen PHQ 2/9 12/21/2018  Decreased Interest 0  Down, Depressed, Hopeless 0  PHQ - 2 Score 0     Other providers/specialists: Patient Care Team: Jarold Motto, [place] as PCP - General (Physician Assistant) [name], MD as Consulting Physician (Dermatology)   PMHx, SurgHx, SocialHx, Medications, and Allergies were reviewed in the Visit Navigator and updated as appropriate.   Past Medical History:  Diagnosis Date   Chronic constipation    History of colon resection    as infant , premature birth,  [birth year]/ 57   Infertility, female    Pelvic adhesions    Vitamin D deficiency      Past Surgical History:  Procedure Laterality Date   COLON SURGERY  May 20, 1992   hemicolectomy , she was premature   LAPAROSCOPIC UNILATERAL SALPINGECTOMY Left 08/18/2019   Procedure: DIAGNOSTIC LAPAROSCOPY, , LYSIS OF ADHESIONS, CHROMOPERTUBATION;  Surgeon: Fermin Schwab, MD;  Location: [facility];  Service: Gynecology;  Laterality: Left;     Family History  Problem Relation Age of Onset   Healthy Mother    Asthma Brother    Diabetes Maternal Grandmother    Hypertension Maternal Grandmother    Breast cancer Maternal Grandmother    Thyroid disease Maternal Grandmother    Diabetes Maternal Grandfather    Hypertension Maternal Grandfather    Diabetes Paternal Grandmother    Hypertension Paternal Grandmother    Diabetes Paternal Grandfather    Heart attack Paternal Grandfather    Hypertension Paternal Grandfather    Asthma Father    Hypertension Father     Social History   Tobacco Use   Smoking status: Never   Smokeless tobacco: Never  Vaping Use   Vaping Use: Never used  Substance Use Topics   Alcohol use: Yes    Alcohol/week: 2.0 standard drinks    Types: 2 Glasses of wine per week   Drug use: Never    Review of Systems:   ROS  Objective:   There were no vitals taken for this visit.  General Appearance:    Alert, cooperative, no distress, appears stated age  Head:    Normocephalic, without obvious abnormality, atraumatic  Eyes:    PERRL, conjunctiva/corneas clear, EOM's intact, fundi    benign, both  eyes  Ears:    Normal TM's and external ear canals, both ears  Nose:   Nares normal, septum midline, mucosa normal, no drainage    or sinus tenderness  Throat:   Lips, mucosa, and tongue normal; teeth and gums normal  Neck:   Supple, symmetrical, trachea midline, no adenopathy;    thyroid:  no enlargement/tenderness/nodules; no carotid   bruit or JVD  Back:     Symmetric, no curvature, ROM normal, no CVA tenderness  Lungs:     Clear to auscultation bilaterally, respirations unlabored  Chest Wall:    No tenderness or deformity   Heart:    Regular rate and rhythm, S1 and S2 normal, no murmur, rub   or gallop  Breast Exam:    No tenderness, masses, or nipple abnormality  Abdomen:     Soft, non-tender, bowel  sounds active all four quadrants,    no masses, no organomegaly  Genitalia:    Normal female without lesion, discharge or tenderness  Rectal:    Normal tone no masses or tenderness  Extremities:   Extremities normal, atraumatic, no cyanosis or edema  Pulses:   2+ and symmetric all extremities  Skin:   Skin color, texture, turgor normal, no rashes or lesions  Lymph nodes:   Cervical, supraclavicular, and axillary nodes normal  Neurologic:   CNII-XII intact, normal strength, sensation and reflexes    throughout    Assessment/Plan:   There are no diagnoses linked to this encounter.   Patient Counseling:   [x]     Nutrition: Stressed importance of moderation in sodium/caffeine intake, saturated fat and cholesterol, caloric balance, sufficient intake of fresh fruits, vegetables, fiber, calcium, iron, and 1 mg of folate supplement per day (for females capable of pregnancy).   [x]      Stressed the importance of regular exercise.    [x]     Substance Abuse: Discussed cessation/primary prevention of tobacco, alcohol, or other drug use; driving or other dangerous activities under the influence; availability of treatment for abuse.    [x]      Injury prevention: Discussed safety belts, safety helmets, smoke detector, smoking near bedding or upholstery.    [x]      Sexuality: Discussed sexually transmitted diseases, partner selection, use of condoms, avoidance of unintended pregnancy  and contraceptive alternatives.    [x]     Dental health: Discussed importance of regular tooth brushing, flossing, and dental visits.   [x]      Health maintenance and immunizations reviewed. Please refer to Health maintenance section.   ***  , PA-C [facility]

## 2020-12-03 ENCOUNTER — Emergency Department (HOSPITAL_COMMUNITY)
Admission: EM | Admit: 2020-12-03 | Discharge: 2020-12-03 | Disposition: A | Discharge status: Home / Self Care | Payer: 59 | Attending: Student | Admitting: Student

## 2020-12-03 ENCOUNTER — Encounter (HOSPITAL_COMMUNITY): Payer: Self-pay | Admitting: Emergency Medicine

## 2020-12-03 ENCOUNTER — Other Ambulatory Visit: Payer: Self-pay

## 2020-12-03 ENCOUNTER — Emergency Department (HOSPITAL_COMMUNITY): Payer: 59

## 2020-12-03 ENCOUNTER — Encounter: Payer: Self-pay | Admitting: Physician Assistant

## 2020-12-03 DIAGNOSIS — M542 Cervicalgia: Secondary | ICD-10-CM | POA: Insufficient documentation

## 2020-12-03 DIAGNOSIS — S50811A Abrasion of right forearm, initial encounter: Secondary | ICD-10-CM | POA: Insufficient documentation

## 2020-12-03 DIAGNOSIS — S59911A Unspecified injury of right forearm, initial encounter: Secondary | ICD-10-CM | POA: Diagnosis present

## 2020-12-03 DIAGNOSIS — Y92410 Unspecified street and highway as the place of occurrence of the external cause: Secondary | ICD-10-CM | POA: Insufficient documentation

## 2020-12-03 DIAGNOSIS — M549 Dorsalgia, unspecified: Secondary | ICD-10-CM | POA: Diagnosis not present

## 2020-12-03 DIAGNOSIS — M25521 Pain in right elbow: Secondary | ICD-10-CM | POA: Diagnosis not present

## 2020-12-03 DIAGNOSIS — R600 Localized edema: Secondary | ICD-10-CM | POA: Diagnosis not present

## 2020-12-03 MED ORDER — METHOCARBAMOL 500 MG PO TABS
500.0000 mg | ORAL_TABLET | Freq: Every evening | ORAL | 0 refills | Status: DC | PRN
Start: 2020-12-03 — End: 2021-04-22

## 2020-12-03 MED ORDER — NAPROXEN 375 MG PO TABS
375.0000 mg | ORAL_TABLET | Freq: Two times a day (BID) | ORAL | 0 refills | Status: DC
Start: 2020-12-03 — End: 2021-04-22
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
## 2020-12-03 NOTE — Discharge Instructions (Addendum)

## 2020-12-16 DIAGNOSIS — N6459 Other signs and symptoms in breast: Secondary | ICD-10-CM | POA: Diagnosis not present

## 2020-12-20 DIAGNOSIS — N6459 Other signs and symptoms in breast: Secondary | ICD-10-CM | POA: Diagnosis not present

## 2020-12-20 DIAGNOSIS — N6002 Solitary cyst of left breast: Secondary | ICD-10-CM | POA: Diagnosis not present

## 2020-12-24 ENCOUNTER — Encounter: Payer: Self-pay | Admitting: Dermatology

## 2020-12-24 ENCOUNTER — Ambulatory Visit (INDEPENDENT_AMBULATORY_CARE_PROVIDER_SITE_OTHER): Payer: 59 | Admitting: Dermatology

## 2020-12-24 ENCOUNTER — Other Ambulatory Visit: Payer: Self-pay

## 2020-12-24 DIAGNOSIS — L910 Hypertrophic scar: Secondary | ICD-10-CM

## 2020-12-24 DIAGNOSIS — L309 Dermatitis, unspecified: Secondary | ICD-10-CM

## 2020-12-24 MED ORDER — HYDROCORTISONE 2.5 % EX CREA: TOPICAL_CREAM | Freq: Two times a day (BID) | CUTANEOUS | 3 refills | Status: DC

## 2020-12-24 MED ORDER — TRIAMCINOLONE ACETONIDE 40 MG/ML IJ SUSP
40.0000 mg | Freq: Once | INTRAMUSCULAR | Status: AC
  Administered 2020-12-24: 40 mg

## 2021-01-06 DIAGNOSIS — F331 Major depressive disorder, recurrent, moderate: Secondary | ICD-10-CM | POA: Diagnosis not present

## 2021-01-08 ENCOUNTER — Encounter: Payer: Self-pay | Admitting: Dermatology

## 2021-01-08 NOTE — Progress Notes (Signed)
   Follow-Up Visit   Subjective  Caitlin Robles is a 30 y.o. female who presents for the following: Keloid (Left sideburn- IL tac at previous office visit).  Keloid on ear slightly worse, recheck rash. Location:  Duration:  Quality:  Associated Signs/Symptoms: Modifying Factors:  Severity:  Timing: Context:   Objective  Well appearing patient in no apparent distress; mood and affect are within normal limits. Left Ear Left ear: Keloid slightly thicker with some symptoms  Chest (Upper Torso, Anterior) Left breast only treatment triamcinolone 0.1 % seems better      A focused examination was performed including head, neck, chest. Relevant physical exam findings are noted in the Assessment and Plan.   Assessment & Plan    Keloid Left Ear  Intralesional injection - Left Ear Location: left ear   Informed Consent: Discussed risks (infection, pain, bleeding, bruising, thinning of the skin, loss of skin pigment, lack of resolution, and recurrence of lesion) and benefits of the procedure, as well as the alternatives. Informed consent was obtained. Preparation: The area was prepared a standard fashion  Procedure Details: An intralesional injection was performed with Kenalog 40 mg/cc. Marland Kitchen5 cc in total were injected.  Total number of injections: 2  Plan: The patient was instructed on post-op care. Recommend OTC analgesia as needed for pain.   Related Medications triamcinolone acetonide (KENALOG-40) injection 40 mg   Dermatitis Chest (Upper Torso, Anterior)  Eczema / dermatitis hytone or protopic if she flares hytone  hydrocortisone 2.5 % cream - Chest (Upper Torso, Anterior) Apply topically 2 (two) times daily.      I, [name], MD, have reviewed all documentation for this visit.  The documentation on 01/08/21 for the exam, diagnosis, procedures, and orders are all accurate and complete.

## 2021-01-20 ENCOUNTER — Ambulatory Visit (INDEPENDENT_AMBULATORY_CARE_PROVIDER_SITE_OTHER): Payer: 59 | Admitting: Family Medicine

## 2021-01-20 ENCOUNTER — Other Ambulatory Visit: Payer: Self-pay

## 2021-01-20 ENCOUNTER — Encounter (INDEPENDENT_AMBULATORY_CARE_PROVIDER_SITE_OTHER): Payer: Self-pay | Admitting: Family Medicine

## 2021-01-20 VITALS — BP 107/69 | HR 96 | Ht 62.5 in | Wt 151.0 lb

## 2021-01-20 DIAGNOSIS — E663 Overweight: Secondary | ICD-10-CM

## 2021-01-20 DIAGNOSIS — R948 Abnormal results of function studies of other organs and systems: Secondary | ICD-10-CM

## 2021-01-20 DIAGNOSIS — R632 Polyphagia: Secondary | ICD-10-CM

## 2021-01-20 MED ORDER — PHENTERMINE HCL 15 MG PO CAPS: 15.0000 mg | ORAL_CAPSULE | ORAL | 0 refills | Status: DC

## 2021-02-12 DIAGNOSIS — R350 Frequency of micturition: Secondary | ICD-10-CM | POA: Diagnosis not present

## 2021-03-02 NOTE — Progress Notes (Signed)
    Assessment/Plan:   Diagnoses and all orders for this visit:  Polyphagia -     phentermine 15 MG capsule; Take 1 capsule (15 mg total) by mouth every morning.  Abnormal metabolism, slower than expected -     phentermine 15 MG capsule; Take 1 capsule (15 mg total) by mouth every morning.  Overweight  Course: Caitlin Robles is currently in the action stage of change. As such, her goal is to continue with weight loss efforts.   Nutrition goals: She has agreed to following a lower carbohydrate, vegetable and lean protein rich diet plan.   Exercise goals: For substantial health benefits, adults should do at least 150 minutes (2 hours and 30 minutes) a week of moderate-intensity, or 75 minutes (1 hour and 15 minutes) a week of vigorous-intensity aerobic physical activity, or an equivalent combination of moderate- and vigorous-intensity aerobic activity. Aerobic activity should be performed in episodes of at least 10 minutes, and preferably, it should be spread throughout the week.  Behavioral modification strategies: increasing lean protein intake, decreasing simple carbohydrates, increasing vegetables, and increasing water intake.  Objective:   Blood pressure 107/69, pulse 96, height 5' 2.5" (1.588 m), weight 151 lb (68.5 kg), SpO2 97 %. Body mass index is 27.18 kg/m.  General: Cooperative, alert, well developed, in no acute distress. HEENT: Conjunctivae and lids unremarkable. Cardiovascular: Regular rhythm.  Lungs: Normal work of breathing. Neurologic: No focal deficits.   Lab Results  Component Value Date   CREATININE 0.92 11/17/2019   BUN 8 11/17/2019   NA 137 11/17/2019   K 3.9 11/17/2019   CL 102 11/17/2019   CO2 25 11/17/2019   Lab Results  Component Value Date   ALT 14 11/17/2019   AST 13 11/17/2019   ALKPHOS 79 12/21/2018   BILITOT 0.6 11/17/2019   Lab Results  Component Value Date   HGBA1C 4.6 11/17/2019   HGBA1C 4.6 01/21/2018   No results found for:  INSULIN Lab Results  Component Value Date   TSH 2.20 11/17/2019   Lab Results  Component Value Date   CHOL 135 11/17/2019   HDL 48 (L) 11/17/2019   LDLCALC 70 11/17/2019   TRIG 83 11/17/2019   CHOLHDL 2.8 11/17/2019   Lab Results  Component Value Date   VD25OH 29 01/31/2018   VD25OH 25 07/19/2017   Lab Results  Component Value Date   WBC 6.5 11/17/2019   HGB 13.4 11/17/2019   HCT 40.6 11/17/2019   MCV 94.4 11/17/2019   PLT 334 11/17/2019   Lab Results  Component Value Date   FERRITIN 86 11/17/2019

## 2021-03-21 DIAGNOSIS — R350 Frequency of micturition: Secondary | ICD-10-CM | POA: Diagnosis not present

## 2021-03-27 DIAGNOSIS — Z3200 Encounter for pregnancy test, result unknown: Secondary | ICD-10-CM | POA: Diagnosis not present

## 2021-03-27 DIAGNOSIS — N7011 Chronic salpingitis: Secondary | ICD-10-CM | POA: Diagnosis not present

## 2021-04-01 ENCOUNTER — Ambulatory Visit: Payer: 59 | Admitting: Dermatology

## 2021-04-03 DIAGNOSIS — Z3141 Encounter for fertility testing: Secondary | ICD-10-CM | POA: Diagnosis not present

## 2021-04-09 DIAGNOSIS — L539 Erythematous condition, unspecified: Secondary | ICD-10-CM | POA: Diagnosis not present

## 2021-04-09 DIAGNOSIS — T161XXA Foreign body in right ear, initial encounter: Secondary | ICD-10-CM | POA: Diagnosis not present

## 2021-04-09 DIAGNOSIS — H938X3 Other specified disorders of ear, bilateral: Secondary | ICD-10-CM | POA: Diagnosis not present

## 2021-04-09 DIAGNOSIS — L299 Pruritus, unspecified: Secondary | ICD-10-CM | POA: Diagnosis not present

## 2021-04-22 ENCOUNTER — Encounter: Payer: Self-pay | Admitting: Physician Assistant

## 2021-04-22 ENCOUNTER — Telehealth: Payer: BC Managed Care – PPO | Admitting: Physician Assistant

## 2021-04-22 DIAGNOSIS — B349 Viral infection, unspecified: Secondary | ICD-10-CM

## 2021-04-22 MED ORDER — FLUTICASONE PROPIONATE 50 MCG/ACT NA SUSP: 2.0000 | Freq: Every day | NASAL | 0 refills | Status: DC

## 2021-04-22 NOTE — Patient Instructions (Signed)
Caitlin Robles, thank you for joining [name], PA-C for today's virtual visit.  While this provider is not your primary care provider (PCP), if your PCP is located in our provider database this encounter information will be shared with them immediately following your visit.  Consent: (Patient) Caitlin Robles provided verbal consent for this virtual visit at the beginning of the encounter.  Current Medications:  Current Outpatient Medications:    hydrocortisone 2.5 % cream, Apply topically 2 (two) times daily., Disp: 30 g, Rfl: 3   loratadine-pseudoephedrine (CLARITIN-D 12-HOUR) 5-120 MG tablet, Take 1 tablet by mouth at bedtime., Disp: , Rfl:    methocarbamol (ROBAXIN) 500 MG tablet, Take 1 tablet (500 mg total) by mouth at bedtime as needed for muscle spasms., Disp: 20 tablet, Rfl: 0   Multiple Vitamins-Minerals (ONE-A-DAY WOMENS PO), Take by mouth daily., Disp: , Rfl:    naproxen (NAPROSYN) 375 MG tablet, Take 1 tablet (375 mg total) by mouth 2 (two) times daily with a meal., Disp: 20 tablet, Rfl: 0   phentermine 15 MG capsule, Take 1 capsule (15 mg total) by mouth every morning., Disp: 30 capsule, Rfl: 0   Medications ordered in this encounter:  No orders of the defined types were placed in this encounter.   *If you need refills on other medications prior to your next appointment, please contact your pharmacy*  Follow-Up: Call back or seek an in-person evaluation if the symptoms worsen or if the condition fails to improve as anticipated.  Other Instructions Please keep well-hydrated and get plenty of rest.  Ok to continue your Claritin-D. Start a saline nasal rinse and the Flonase as directed. I am glad your stomach symptoms are much improved. Follow the dietary recommendations below. You can use OTC Immodium as needed. Start a daily probiotic.   If symptoms are not resolving or you note anything new or worsening, please let us know ASAP or reach out to your PCP.   Bland  Diet A bland diet consists of foods that are often soft and do not have a lot of fat, fiber, or extra seasonings. Foods without fat, fiber, or seasoning are easier for the body to digest. They are also less likely to irritate your mouth, throat, stomach, and other parts of your digestive system. A bland diet is sometimes called a BRAT diet. What is my plan? Your health care provider or food and nutrition specialist (dietitian) may recommend specific changes to your diet to prevent symptoms or to treat your symptoms. These changes may include: Eating small meals often. Cooking food until it is soft enough to chew easily. Chewing your food well. Drinking fluids slowly. Not eating foods that are very spicy, sour, or fatty. Not eating citrus fruits, such as oranges and grapefruit. What do I need to know about this diet? Eat a variety of foods from the bland diet food list. Do not follow a bland diet longer than needed. Ask your health care provider whether you should take vitamins or supplements. What foods can I eat? Grains Hot cereals, such as cream of wheat. Rice. Bread, crackers, or tortillas made from refined white flour. Vegetables Canned or cooked vegetables. Mashed or boiled potatoes. Fruits Bananas. Applesauce. Other types of cooked or canned fruit with the skin and seeds removed, such as canned peaches or pears. Meats and other proteins Scrambled eggs. Creamy peanut butter or other nut butters. Lean, well-cooked meats, such as chicken or fish. Tofu. Soups or broths. Dairy Low-fat dairy products,  such as milk, cottage cheese, or yogurt. Beverages Water. Herbal tea. Apple juice. Fats and oils Mild salad dressings. Canola or olive oil. Sweets and desserts Pudding. Custard. Fruit gelatin. Ice cream. The items listed above may not be a complete list of recommended foods and beverages. Contact a dietitian for more options. What foods are not recommended? Grains Whole grain breads  and cereals. Vegetables Raw vegetables. Fruits Raw fruits, especially citrus, berries, or dried fruits. Dairy Whole fat dairy foods. Beverages Caffeinated drinks. Alcohol. Seasonings and condiments Strongly flavored seasonings or condiments. Hot sauce. Salsa. Other foods Spicy foods. Fried foods. Sour foods, such as pickled or fermented foods. Foods with high sugar content. Foods high in fiber. The items listed above may not be a complete list of foods and beverages to avoid. Contact a dietitian for more information. Summary A bland diet consists of foods that are often soft and do not have a lot of fat, fiber, or extra seasonings. Foods without fat, fiber, or seasoning are easier for the body to digest. Check with your health care provider to see how long you should follow this diet plan. It is not meant to be followed for long periods. This information is not intended to replace advice given to you by your health care provider. Make sure you discuss any questions you have with your health care provider. Document Revised: 04/21/2017 Document Reviewed: 04/21/2017 Elsevier Patient Education  2022 [employer].    If you have been instructed to have an in-person evaluation today at a local Urgent Care facility, please use the link below. It will take you to a list of all of our available [facility], including address, phone number and hours of operation. Please do not delay care.  [facility]  If you or a family member do not have a primary care provider, use the link below to schedule a visit and establish care. When you choose a [facility] primary care physician or advanced practice provider, you gain a long-term partner in health. Find a Primary Care Provider  Learn more about [facility]'s in-office and virtual care options: [facility] - Get Care Now

## 2021-04-22 NOTE — Progress Notes (Signed)
Virtual Visit Consent   Caitlin Robles, you are scheduled for a virtual visit with a [facility] provider today.     Just as with appointments in the office, your consent must be obtained to participate.  Your consent will be active for this visit and any virtual visit you may have with one of our providers in the next 365 days.     If you have a MyChart account, a copy of this consent can be sent to you electronically.  All virtual visits are billed to your insurance company just like a traditional visit in the office.    As this is a virtual visit, video technology does not allow for your provider to perform a traditional examination.  This may limit your provider's ability to fully assess your condition.  If your provider identifies any concerns that need to be evaluated in person or the need to arrange testing (such as labs, EKG, etc.), we will make arrangements to do so.     Although advances in technology are sophisticated, we cannot ensure that it will always work on either your end or our end.  If the connection with a video visit is poor, the visit may have to be switched to a telephone visit.  With either a video or telephone visit, we are not always able to ensure that we have a secure connection.     I need to obtain your verbal consent now.   Are you willing to proceed with your visit today?    Caitlin Robles has provided verbal consent on 04/22/2021 for a virtual visit (video or telephone).   Caitlin Robles, [place]   Date: 04/22/2021 9:43 AM   Virtual Visit via Video Note   I, Caitlin Robles, connected with  AVIELLE IMBERT  ([id number], 04-08-1990) on 04/22/21 at  9:30 AM EST by a video-enabled telemedicine application and verified that I am speaking with the correct person using two identifiers.  Location: Patient: Virtual Visit Location Patient: Home Provider: Virtual Visit Location Provider: Home Office   I discussed the limitations of evaluation and management by  telemedicine and the availability of in person appointments. The patient expressed understanding and agreed to proceed.    History of Present Illness: Caitlin Robles is a 31 y.o. who identifies as a female who was assigned female at birth, and is being seen today for symptoms starting Sunday with loose stool, nausea and vomiting. Notes the vomiting has resolved but still with loose stool. Denies melena, hematochezia or tenesmus. Still some mild cramping. Now with some mild nasal congestion. Notes some nasal tenderness with congestion. Denies sinus pain. Has taken OTC Pepto and Mucinex for symptoms.   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  Resting comfortably at home.  Head is normocephalic, atraumatic.  No labored breathing. Speech is clear and coherent with logical content.  Patient is alert and oriented at baseline.   Assessment and Plan: 1. Viral illness - fluticasone (FLONASE) 50 MCG/ACT nasal spray; Place 2 sprays into both nostrils daily.  Dispense: 16 g; Refill: 0  With GI and mild respiratory symptoms. Afebrile. No alarm signs/symptoms present. Improving/resolving. Supportive measures, OTC  medications and Vitamin recommendations reviewed. Start [employer] Rx Flonase for nasal congestion. Continue antihistamine-decongestant combination. Immodium OTC as needed. Follow-up if not continuing to resolve.   Follow Up Instructions: I discussed the assessment and treatment plan with the patient. The patient was provided an opportunity to ask questions and all were answered. The patient agreed with the plan and demonstrated an understanding of the instructions.  A copy of instructions were sent to the patient via MyChart unless otherwise noted below.   The patient was advised to call back or seek an in-person evaluation if the symptoms worsen or if the condition fails to improve as anticipated.  Time:  I spent 10 minutes with the patient via telehealth technology discussing the above problems/concerns.    Caitlin Climes, PA-C

## 2021-04-23 DIAGNOSIS — N7011 Chronic salpingitis: Secondary | ICD-10-CM | POA: Diagnosis not present

## 2021-04-25 DIAGNOSIS — R300 Dysuria: Secondary | ICD-10-CM | POA: Diagnosis not present

## 2021-05-06 ENCOUNTER — Encounter: Payer: Self-pay | Admitting: Dermatology

## 2021-05-07 ENCOUNTER — Ambulatory Visit: Payer: 59 | Admitting: Dermatology

## 2021-05-29 ENCOUNTER — Ambulatory Visit: Payer: 59 | Admitting: Dermatology

## 2021-06-18 ENCOUNTER — Ambulatory Visit: Payer: 59 | Admitting: Dermatology

## 2021-07-03 DIAGNOSIS — N7011 Chronic salpingitis: Secondary | ICD-10-CM | POA: Diagnosis not present

## 2021-07-03 DIAGNOSIS — N736 Female pelvic peritoneal adhesions (postinfective): Secondary | ICD-10-CM | POA: Diagnosis not present

## 2021-07-03 DIAGNOSIS — B9689 Other specified bacterial agents as the cause of diseases classified elsewhere: Secondary | ICD-10-CM | POA: Diagnosis not present

## 2021-07-09 DIAGNOSIS — Z31430 Encounter of female for testing for genetic disease carrier status for procreative management: Secondary | ICD-10-CM | POA: Diagnosis not present

## 2021-07-09 DIAGNOSIS — N971 Female infertility of tubal origin: Secondary | ICD-10-CM | POA: Diagnosis not present

## 2021-07-09 DIAGNOSIS — Z319 Encounter for procreative management, unspecified: Secondary | ICD-10-CM | POA: Diagnosis not present

## 2021-07-25 DIAGNOSIS — R309 Painful micturition, unspecified: Secondary | ICD-10-CM | POA: Diagnosis not present

## 2021-07-25 DIAGNOSIS — R350 Frequency of micturition: Secondary | ICD-10-CM | POA: Diagnosis not present

## 2021-08-25 ENCOUNTER — Telehealth: Payer: BC Managed Care – PPO | Admitting: Nurse Practitioner

## 2021-08-25 DIAGNOSIS — N3000 Acute cystitis without hematuria: Secondary | ICD-10-CM

## 2021-08-25 MED ORDER — NITROFURANTOIN MONOHYD MACRO 100 MG PO CAPS: 100.0000 mg | ORAL_CAPSULE | Freq: Two times a day (BID) | ORAL | 0 refills | Status: AC

## 2021-08-25 NOTE — Progress Notes (Signed)
Virtual Visit Consent   Caitlin Robles, you are scheduled for a virtual visit with a [facility] provider today. Just as with appointments in the office, your consent must be obtained to participate. Your consent will be active for this visit and any virtual visit you may have with one of our providers in the next 365 days. If you have a MyChart account, a copy of this consent can be sent to you electronically.  As this is a virtual visit, video technology does not allow for your provider to perform a traditional examination. This may limit your provider's ability to fully assess your condition. If your provider identifies any concerns that need to be evaluated in person or the need to arrange testing (such as labs, EKG, etc.), we will make arrangements to do so. Although advances in technology are sophisticated, we cannot ensure that it will always work on either your end or our end. If the connection with a video visit is poor, the visit may have to be switched to a telephone visit. With either a video or telephone visit, we are not always able to ensure that we have a secure connection.  By engaging in this virtual visit, you consent to the provision of healthcare and authorize for your insurance to be billed (if applicable) for the services provided during this visit. Depending on your insurance coverage, you may receive a charge related to this service.  I need to obtain your verbal consent now. Are you willing to proceed with your visit today? Caitlin Robles has provided verbal consent on 08/25/2021 for a virtual visit (video or telephone). Viviano Simas, FNP  Date: 08/25/2021 5:32 PM  Virtual Visit via Video Note   I, Viviano Simas, connected with  Caitlin Robles  ([id number], 1991-01-22) on 08/25/21 at  5:30 PM EDT by a video-enabled telemedicine application and verified that I am speaking with the correct person using two identifiers.  Location: Patient: Virtual Visit Location Patient: Home Provider:  Virtual Visit Location Provider: Home Office   I discussed the limitations of evaluation and management by telemedicine and the availability of in person appointments. The patient expressed understanding and agreed to proceed.    History of Present Illness: Caitlin Robles is a 31 y.o. who identifies as a female who was assigned female at birth, and is being seen today with complaints of   She has had 2 UTIs recently.  She has been sent to a urologist by her PCP who directed her to use cranberry supplements and to increase her water intake   She has noted that this happens after intercourse, she feels that she started to urinate with more pressure, not pain. She has urgency to urinate. Denies incontinence.  This are the symptoms she associated with UTIs in the past.  Denies vaginal discomfort or discharge.   Denies fever or back pain.  She has had mild nausea.   She is sexually active, has not taken a pregnancy test.  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  Resting comfortably  at home.  Head is normocephalic, atraumatic.  No labored breathing.  Speech is clear and coherent with logical content.  Patient is alert and oriented at baseline.    Assessment and Plan: 1. Acute cystitis without hematuria  - nitrofurantoin, macrocrystal-monohydrate, (MACROBID) 100 MG capsule; Take 1 capsule (100 mg total) by mouth 2 (two) times daily for 5 days.  Dispense: 10 capsule; Refill: 0    Continue cranberry supplements and increase water intake.  Encouraged follow up with PCP to discuss possible post sexual intercourse antibiotic use for future as needed   Follow Up Instructions: I discussed the assessment and treatment plan with the patient. The patient was provided an opportunity to ask questions and all were answered. The patient agreed with the plan and demonstrated an understanding of the instructions.  A copy of instructions were sent to the patient via MyChart unless otherwise noted below.    The patient was advised to call back or seek an in-person evaluation if the symptoms worsen or if the condition fails to improve as anticipated.  Time:  I spent 15 minutes with the patient via telehealth technology discussing the above problems/concerns.    Apolonio Schneiders, FNP

## 2021-09-15 ENCOUNTER — Ambulatory Visit: Payer: 59 | Admitting: Dermatology

## 2021-10-13 ENCOUNTER — Ambulatory Visit: Payer: BC Managed Care – PPO | Admitting: Dermatology

## 2021-10-13 DIAGNOSIS — L910 Hypertrophic scar: Secondary | ICD-10-CM | POA: Diagnosis not present

## 2021-10-13 MED ORDER — TRIAMCINOLONE ACETONIDE 40 MG/ML IJ SUSP
40.0000 mg | Freq: Once | INTRAMUSCULAR | Status: AC
  Administered 2021-10-13: 40 mg

## 2021-10-15 ENCOUNTER — Telehealth: Payer: BC Managed Care – PPO | Admitting: Physician Assistant

## 2021-10-15 ENCOUNTER — Telehealth: Payer: Self-pay | Admitting: Dermatology

## 2021-10-15 DIAGNOSIS — M791 Myalgia, unspecified site: Secondary | ICD-10-CM

## 2021-10-15 MED ORDER — TIZANIDINE HCL 2 MG PO TABS: 2.0000 mg | ORAL_TABLET | Freq: Four times a day (QID) | ORAL | 0 refills | Status: DC | PRN

## 2021-10-15 MED ORDER — NAPROXEN SODIUM 550 MG PO TABS: 550.0000 mg | ORAL_TABLET | Freq: Two times a day (BID) | ORAL | 0 refills | Status: DC
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
## 2021-10-15 NOTE — Telephone Encounter (Signed)
Was here Monday. Wants to know which dermatologist ST told her to see if he's no longer around.

## 2021-10-16 DIAGNOSIS — L910 Hypertrophic scar: Secondary | ICD-10-CM | POA: Diagnosis not present

## 2021-10-16 DIAGNOSIS — R52 Pain, unspecified: Secondary | ICD-10-CM | POA: Diagnosis not present

## 2021-11-03 ENCOUNTER — Encounter: Payer: Self-pay | Admitting: Dermatology

## 2021-11-03 NOTE — Progress Notes (Signed)
   Follow-Up Visit   Subjective  Caitlin Robles is a 31 y.o. female who presents for the following: Follow-up (Follow up on keloid on left top ear. No much better. ).  Keloid left ear Location:  Duration:  Quality:  Associated Signs/Symptoms: Modifying Factors:  Severity:  Timing: Context:   Objective  Well appearing patient in no apparent distress; mood and affect are within normal limits. Left Ear Unfortunately there has been significant with elevation of the keloid on the left posterior upper ear.    A focused examination was performed including left ear. Relevant physical exam findings are noted in the Assessment and Plan.   Assessment & Plan    Keloid Left Ear  All options again removed with patient.  Although we did mutually choose to inject the nodule on the hope that it may shrink 50+ percent, I believe the only way to produce 90% improvement would be more surgery which of course could make the keloid worse.  triamcinolone acetonide (KENALOG-40) injection 40 mg - Left Ear   Intralesional injection - Left Ear Location: left ear  Informed Consent: Discussed risks (infection, pain, bleeding, bruising, thinning of the skin, loss of skin pigment,  Indentation, lack of resolution, and recurrence of lesion) and benefits of the procedure, as well as the alternatives. Informed consent was obtained. Preparation: The area was prepared in a standard fashion.   Procedure Details: An intralesional injection was performed with Kenalog 40 mg/cc. 0.2 cc in total were injected.  Total number of injections: 1  Plan: The patient was instructed on post-op care. Recommend OTC analgesia as needed for pain.       I, Caitlin Harder, MD, have reviewed all documentation for this visit.  The documentation on 11/03/21 for the exam, diagnosis, procedures, and orders are all accurate and complete.

## 2021-11-12 ENCOUNTER — Encounter (INDEPENDENT_AMBULATORY_CARE_PROVIDER_SITE_OTHER): Payer: Self-pay

## 2021-12-29 ENCOUNTER — Encounter: Payer: Self-pay | Admitting: *Deleted

## 2022-02-18 ENCOUNTER — Telehealth: Payer: BC Managed Care – PPO | Admitting: Physician Assistant

## 2022-02-18 DIAGNOSIS — J069 Acute upper respiratory infection, unspecified: Secondary | ICD-10-CM | POA: Diagnosis not present

## 2022-02-18 MED ORDER — LEVOCETIRIZINE DIHYDROCHLORIDE 5 MG PO TABS: 5.0000 mg | ORAL_TABLET | Freq: Every evening | ORAL | 0 refills | Status: DC

## 2022-02-18 NOTE — Patient Instructions (Signed)
  Rockie Neighbours, thank you for joining [name], PA-C for today's virtual visit.  While this provider is not your primary care provider (PCP), if your PCP is located in our provider database this encounter information will be shared with them immediately following your visit.   A [facility] MyChart account gives you access to today's visit and all your visits, tests, and labs performed at [facility] " click here if you don't have a [facility] MyChart account or go to [email or web address]  Consent: (Patient) Rockie Neighbours provided verbal consent for this virtual visit at the beginning of the encounter.  Current Medications:  Current Outpatient Medications:    fluticasone (FLONASE) 50 MCG/ACT nasal spray, Place 2 sprays into both nostrils daily., Disp: 16 g, Rfl: 0   hydrocortisone 2.5 % cream, Apply topically 2 (two) times daily., Disp: 30 g, Rfl: 3   loratadine-pseudoephedrine (CLARITIN-D 12-HOUR) 5-120 MG tablet, Take 1 tablet by mouth at bedtime., Disp: , Rfl:    Multiple Vitamins-Minerals (ONE-A-DAY WOMENS PO), Take by mouth daily., Disp: , Rfl:    naproxen sodium (ANAPROX) 550 MG tablet, Take 1 tablet (550 mg total) by mouth 2 (two) times daily with a meal., Disp: 15 tablet, Rfl: 0   tiZANidine (ZANAFLEX) 2 MG tablet, Take 1-2 tablets (2-4 mg total) by mouth every 6 (six) hours as needed for muscle spasms., Disp: 15 tablet, Rfl: 0   Medications ordered in this encounter:  No orders of the defined types were placed in this encounter.    *If you need refills on other medications prior to your next appointment, please contact your pharmacy*  Follow-Up: Call back or seek an in-person evaluation if the symptoms worsen or if the condition fails to improve as anticipated.  [facility] [phone]  Other Instructions Please stop the Claritin-D Start the Xyzal once daily with OTC Sudafed. Use a saline nasal rinse 1-2 x daily. Continue Flonase.   Follow-up if not resolving.    If you have been instructed to have an in-person evaluation today at a local Urgent Care facility, please use the link below. It will take you to a list of all of our available [facility], including address, phone number and hours of operation. Please do not delay care.  [facility]  If you or a family member do not have a primary care provider, use the link below to schedule a visit and establish care. When you choose a [facility] primary care physician or advanced practice provider, you gain a long-term partner in health. Find a Primary Care Provider  Learn more about [facility]'s in-office and virtual care options: [facility] - Get Care Now

## 2022-02-18 NOTE — Progress Notes (Signed)
Virtual Visit Consent   SIMI BRIEL, you are scheduled for a virtual visit with a [facility] provider today. Just as with appointments in the office, your consent must be obtained to participate. Your consent will be active for this visit and any virtual visit you may have with one of our providers in the next 365 days. If you have a MyChart account, a copy of this consent can be sent to you electronically.  As this is a virtual visit, video technology does not allow for your provider to perform a traditional examination. This may limit your provider's ability to fully assess your condition. If your provider identifies any concerns that need to be evaluated in person or the need to arrange testing (such as labs, EKG, etc.), we will make arrangements to do so. Although advances in technology are sophisticated, we cannot ensure that it will always work on either your end or our end. If the connection with a video visit is poor, the visit may have to be switched to a telephone visit. With either a video or telephone visit, we are not always able to ensure that we have a secure connection.  By engaging in this virtual visit, you consent to the provision of healthcare and authorize for your insurance to be billed (if applicable) for the services provided during this visit. Depending on your insurance coverage, you may receive a charge related to this service.  I need to obtain your verbal consent now. Are you willing to proceed with your visit today? Caitlin Robles has provided verbal consent on 02/18/2022 for a virtual visit (video or telephone). Caitlin Robles, [place]  Date: 02/18/2022 9:04 AM  Virtual Visit via Video Note   I, Caitlin Robles, connected with  Caitlin Robles  ([id number], 17-Aug-1990) on 02/18/22 at  9:00 AM EST by a video-enabled telemedicine application and verified that I am speaking with the correct person using two identifiers.  Location: Patient: Virtual Visit Location  Patient: Home Provider: Virtual Visit Location Provider: Home Office   I discussed the limitations of evaluation and management by telemedicine and the availability of in person appointments. The patient expressed understanding and agreed to proceed.    History of Present Illness: Caitlin Robles is a 31 y.o. who identifies as a female who was assigned female at birth, and is being seen today for 2 days of nasal congestion with scratchy throat and PND. Denies fever, chills, malaise or fatigue. Occasional dry cough from PND. Denies recent travel or sick contact. Substantial history of allergies for which she takes Flonase and Claritin-D on a regular basis.     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  Resting comfortably at home.  Head is normocephalic, atraumatic.  No labored breathing. Speech is clear and coherent with logical content.  Patient is alert and oriented at baseline.   Assessment and Plan: 1. Viral URI with cough - levocetirizine (XYZAL) 5 MG tablet; Take 1 tablet (5 mg total) by mouth every evening.  Dispense: 30 tablet; Refill: 0  Versus breakthrough allergy symptoms. Increase fluids. Rest.  Start saline rinse. Continue Flonase. Will stop Claritin-D for now since she has taken for a while. Start xyzal (Rx sent) and OTC Sudafed. Follow-up if not calming down quickly. Work note provided.   Follow Up Instructions: I discussed the assessment and treatment plan with the patient. The patient was provided an opportunity to ask questions and all were answered. The patient agreed with the plan and demonstrated an understanding of the instructions.  A copy of instructions were sent to the patient via MyChart unless otherwise noted below.   The patient was advised to call back or seek an in-person evaluation if the symptoms worsen or if the condition fails to improve as anticipated.  Time:  I spent 10 minutes with the patient via telehealth technology discussing the above problems/concerns.    Caitlin Climes, PA-C

## 2022-03-12 ENCOUNTER — Ambulatory Visit (INDEPENDENT_AMBULATORY_CARE_PROVIDER_SITE_OTHER): Payer: No Typology Code available for payment source | Admitting: Family Medicine

## 2022-03-12 ENCOUNTER — Encounter: Payer: Self-pay | Admitting: Family Medicine

## 2022-03-12 VITALS — BP 115/76 | HR 89 | Ht 62.0 in | Wt 162.0 lb

## 2022-03-12 DIAGNOSIS — F411 Generalized anxiety disorder: Secondary | ICD-10-CM | POA: Diagnosis not present

## 2022-03-12 MED ORDER — ESCITALOPRAM OXALATE 5 MG PO TABS: 5.0000 mg | ORAL_TABLET | Freq: Every day | ORAL | 3 refills | Status: DC

## 2022-03-12 NOTE — Assessment & Plan Note (Addendum)
-   pt admits to increase anxiety with home life  - will go ahead and start a trial of lexapro 5mg  to see if this improves anxious type symptoms - had a thorough discussion on side effects of medications and how it takes 4-6 weeks to build up in her system. - denies homicidal/suicidal ideation  - discussed possibly returning back to therapy  - RTC in one month for eval of efficacy of lexapro 5mg  - did discuss that if she were to get pregnant we would need to talk to OBGYN about medication during pregnancy as zoloft is the preferred med

## 2022-03-12 NOTE — Progress Notes (Signed)
New Patient Office Visit  Subjective    Patient ID: Caitlin Robles, female    DOB: 1991-01-27  Age: 31 y.o. MRN: [id number]  CC:  Chief Complaint  Patient presents with   Establish Care    HPI Caitlin Robles presents to establish care. She has concerns of anxiety today.   Outpatient Encounter Medications as of 03/12/2022  Medication Sig   escitalopram (LEXAPRO) 5 MG tablet Take 1 tablet (5 mg total) by mouth daily.   Multiple Vitamins-Minerals (ONE-A-DAY WOMENS PO) Take by mouth daily.   [DISCONTINUED] fluticasone (FLONASE) 50 MCG/ACT nasal spray Place 2 sprays into both nostrils daily. (Patient not taking: Reported on 03/12/2022)   [DISCONTINUED] hydrocortisone 2.5 % cream Apply topically 2 (two) times daily. (Patient not taking: Reported on 03/12/2022)   [DISCONTINUED] levocetirizine (XYZAL) 5 MG tablet Take 1 tablet (5 mg total) by mouth every evening. (Patient not taking: Reported on 03/12/2022)   No facility-administered encounter medications on file as of 03/12/2022.    Past Medical History:  Diagnosis Date   Chronic constipation    History of colon resection    as infant , premature birth,  [birth year] 90   Infertility, female    Pelvic adhesions    Vitamin D deficiency     Past Surgical History:  Procedure Laterality Date   COLON SURGERY  09-Jan-1993   hemicolectomy , she was premature   LAPAROSCOPIC UNILATERAL SALPINGECTOMY Left 08/18/2019   Procedure: DIAGNOSTIC LAPAROSCOPY, , LYSIS OF ADHESIONS, CHROMOPERTUBATION;  Surgeon: Fermin Schwab, MD;  Location: [facility];  Service: Gynecology;  Laterality: Left;    Family History  Problem Relation Age of Onset   Healthy Mother    Asthma Brother    Diabetes Maternal Grandmother    Hypertension Maternal Grandmother    Breast cancer Maternal Grandmother    Thyroid disease Maternal Grandmother    Diabetes Maternal Grandfather    Hypertension Maternal Grandfather    Diabetes Paternal Grandmother    Hypertension  Paternal Grandmother    Diabetes Paternal Grandfather    Heart attack Paternal Grandfather    Hypertension Paternal Grandfather    Asthma Father    Hypertension Father     Social History   Socioeconomic History   Marital status: Married    Spouse name: Not on file   Number of children: Not on file   Years of education: Not on file   Highest education level: Not on file  Occupational History   Not on file  Tobacco Use   Smoking status: Never   Smokeless tobacco: Never  Vaping Use   Vaping Use: Never used  Substance and Sexual Activity   Alcohol use: Yes    Alcohol/week: 2.0 standard drinks of alcohol    Types: 2 Glasses of wine per week   Drug use: Never   Sexual activity: Yes    Birth control/protection: None  Other Topics Concern   Not on file  Social History Narrative   Not on file   Social Determinants of Health   Financial Resource Strain: Not on file  Food Insecurity: Not on file  Transportation Needs: Not on file  Physical Activity: Not on file  Stress: Not on file  Social Connections: Not on file  Intimate Partner Violence: Not on file    Review of Systems  Constitutional:  Negative for chills and fever.  Respiratory:  Negative for cough and shortness of breath.   Cardiovascular:  Negative for chest pain.  Neurological:  Negative for headaches.        Objective    BP 115/76   Pulse 89   Ht 5\' 2"  (1.575 m)   Wt 162 lb (73.5 kg)   SpO2 99%   BMI 29.63 kg/m   Physical Exam Vitals reviewed.  Constitutional:      Appearance: She is well-developed.  HENT:     Head: Normocephalic and atraumatic.  Eyes:     Conjunctiva/sclera: Conjunctivae normal.  Cardiovascular:     Rate and Rhythm: Normal rate.  Pulmonary:     Effort: Pulmonary effort is normal.  Skin:    General: Skin is dry.     Coloration: Skin is not pale.  Neurological:     Mental Status: She is alert and oriented to person, place, and time.  Psychiatric:        Behavior:  Behavior normal.       Assessment & Plan:   Problem List Items Addressed This Visit       Other   GAD (generalized anxiety disorder) - Primary    - pt admits to increase anxiety with home life  - will go ahead and start a trial of lexapro 5mg  to see if this improves anxious type symptoms - had a thorough discussion on side effects of medications and how it takes 4-6 weeks to build up in her system. - denies homicidal/suicidal ideation  - discussed possibly returning back to therapy  - RTC in one month for eval of efficacy of lexapro 5mg  - did discuss that if she were to get pregnant we would need to talk to OBGYN about medication during pregnancy as zoloft is the preferred med        Relevant Medications   escitalopram (LEXAPRO) 5 MG tablet    Return in about 4 weeks (around 04/09/2022).   , DO

## 2022-03-19 ENCOUNTER — Encounter: Payer: Self-pay | Admitting: *Deleted

## 2022-04-30 ENCOUNTER — Telehealth: Payer: Self-pay | Admitting: Neurology

## 2022-04-30 DIAGNOSIS — Z113 Encounter for screening for infections with a predominantly sexual mode of transmission: Secondary | ICD-10-CM | POA: Diagnosis not present

## 2022-04-30 DIAGNOSIS — N760 Acute vaginitis: Secondary | ICD-10-CM | POA: Diagnosis not present

## 2022-04-30 NOTE — Telephone Encounter (Signed)
Patient wants to test for STDs "just to be sure". No vaginal discharge, pain, itchy, she just feels "off". She wants to do sure swab. Aware this will not test for HIV, hepatitis, syphilis, but would like to proceed with testing. Sure swab sent via self swab.

## 2022-05-01 LAB — SURESWAB® ADVANCED VAGINITIS PLUS,TMA
C. trachomatis RNA, TMA: NOT DETECTED
CANDIDA SPECIES: NOT DETECTED
Candida glabrata: NOT DETECTED
N. gonorrhoeae RNA, TMA: NOT DETECTED
SURESWAB(R) ADV BACTERIAL VAGINOSIS(BV),TMA: NEGATIVE
TRICHOMONAS VAGINALIS (TV),TMA: NOT DETECTED

## 2022-05-04 NOTE — Progress Notes (Signed)
Your swab is negative for yeast, bacteria or GC/chlamydia

## 2022-05-28 ENCOUNTER — Encounter: Payer: Self-pay | Admitting: Family Medicine

## 2022-05-28 ENCOUNTER — Ambulatory Visit (INDEPENDENT_AMBULATORY_CARE_PROVIDER_SITE_OTHER): Payer: 59 | Admitting: Family Medicine

## 2022-05-28 VITALS — BP 134/91 | HR 83 | Ht 62.0 in | Wt 164.1 lb

## 2022-05-28 DIAGNOSIS — E66811 Obesity, class 1: Secondary | ICD-10-CM | POA: Insufficient documentation

## 2022-05-28 DIAGNOSIS — E669 Obesity, unspecified: Secondary | ICD-10-CM | POA: Insufficient documentation

## 2022-05-28 MED ORDER — ZEPBOUND 5 MG/0.5ML ~~LOC~~ SOAJ: 5.0000 mg | SUBCUTANEOUS | 0 refills | Status: DC

## 2022-05-28 MED ORDER — ZEPBOUND 7.5 MG/0.5ML ~~LOC~~ SOAJ: 7.5000 mg | SUBCUTANEOUS | 0 refills | Status: DC

## 2022-05-28 MED ORDER — ZEPBOUND 2.5 MG/0.5ML ~~LOC~~ SOAJ: 2.5000 mg | SUBCUTANEOUS | 0 refills | Status: DC

## 2022-05-28 NOTE — Assessment & Plan Note (Signed)
She does have obesity with BMI of 30.01.  She would like to try Zepbound to help with weight management.  Encouraged to incorporate more activity and continue to work on dietary changes.  Side effects of medication reviewed including red flags of severe abdominal pain or new nodules in the neck.

## 2022-05-28 NOTE — Progress Notes (Signed)
Caitlin Robles - 32 y.o. female MRN [id number]  Date of birth: June 23, 1990  Subjective Chief Complaint  Patient presents with   Weight Loss    HPI Caitlin Robles is a 32 y.o. female here today to discuss weight management.  She has had difficulty with weight loss. Weight is up about 20lbs over the past year.   She has tried various diets in the past.  Had some success with keto but put weight back on after stopping.  She does admit to some increased stress and anxiety regarding relationship and home life.  She is moderately active.  She is interested in trying medication for weight loss.  She has not been on medication in the past.  Would prefer to try GLP-1.  No family history of medullary thyroid cancer or MEN syndrome.  She does have some mild constipation at baseline.   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   History of colon resection    as infant , premature birth,  10-Jan-1993   Infertility, female    Pelvic adhesions    Vitamin D deficiency     Past Surgical History:  Procedure Laterality Date   COLON SURGERY  04/14/92   hemicolectomy , she was premature   LAPAROSCOPIC UNILATERAL SALPINGECTOMY Left 08/18/2019   Procedure: DIAGNOSTIC LAPAROSCOPY, , LYSIS OF ADHESIONS, CHROMOPERTUBATION;  Surgeon: Governor Specking, MD;  Location: [facility];  Service: Gynecology;  Laterality: Left;    Social History   Socioeconomic History   Marital status: Married    Spouse name: Not on file   Number of children: Not on file   Years of education: Not on file   Highest education level: Not on file  Occupational History   Not on file  Tobacco Use   Smoking status: Never   Smokeless tobacco: Never  Vaping Use   Vaping Use: Never used  Substance and Sexual Activity   Alcohol use: Yes    Alcohol/week: 2.0  standard drinks of alcohol    Types: 2 Glasses of wine per week   Drug use: Never   Sexual activity: Yes    Birth control/protection: None  Other Topics Concern   Not on file  Social History Narrative   Not on file   Social Determinants of Health   Financial Resource Strain: Not on file  Food Insecurity: Not on file  Transportation Needs: Not on file  Physical Activity: Not on file  Stress: Not on file  Social Connections: Not on file    Family History  Problem Relation Age of Onset   Healthy Mother    Asthma Brother    Diabetes Maternal Grandmother    Hypertension Maternal Grandmother    Breast cancer Maternal Grandmother    Thyroid disease Maternal Grandmother    Diabetes Maternal Grandfather    Hypertension Maternal Grandfather    Diabetes Paternal Grandmother    Hypertension Paternal Grandmother    Diabetes Paternal Grandfather    Heart attack Paternal Grandfather    Hypertension Paternal Grandfather    Asthma Father    Hypertension Father     Health Maintenance  Topic Date Due   COVID-19 Vaccine (3 - 2023-24 season) 06/13/2023 (Originally 12/05/2021)   PAP-Cervical Cytology Screening  11/16/2023   PAP SMEAR-Modifier  11/16/2023  DTaP/Tdap/Td (2 - Tdap) 12/15/2026   INFLUENZA VACCINE  Completed   Hepatitis C Screening  Completed   HIV Screening  Completed   HPV VACCINES  Aged Out     ----------------------------------------------------------------------------------------------------------------------------------------------------------------------------------------------------------------- Physical Exam BP (!) 134/91 (BP Location: Left Arm, Patient Position: Sitting, Cuff Size: Large)   Pulse 83   Ht 5' 2"$  (1.575 m)   Wt 164 lb 1.3 oz (74.4 kg)   SpO2 100%   BMI 30.01 kg/m   Physical Exam Constitutional:      Appearance: Normal appearance.  Neurological:     General: No focal deficit present.     Mental Status: She is alert.      ------------------------------------------------------------------------------------------------------------------------------------------------------------------------------------------------------------------- Assessment and Plan  Obesity (BMI 30.0-34.9) She does have obesity with BMI of 30.01.  She would like to try Zepbound to help with weight management.  Encouraged to incorporate more activity and continue to work on dietary changes.  Side effects of medication reviewed including red flags of severe abdominal pain or new nodules in the neck.    Meds ordered this encounter  Medications   tirzepatide (ZEPBOUND) 2.5 MG/0.5ML Pen    Sig: Inject 2.5 mg into the skin once a week. Increase to 29m after 4 weeks.    Dispense:  2 mL    Refill:  0   tirzepatide (ZEPBOUND) 5 MG/0.5ML Pen    Sig: Inject 5 mg into the skin once a week. Increase to 7.568mafter 4 weeks.    Dispense:  2 mL    Refill:  0   tirzepatide (ZEPBOUND) 7.5 MG/0.5ML Pen    Sig: Inject 7.5 mg into the skin once a week.    Dispense:  2 mL    Refill:  0    No follow-ups on file.    This visit occurred during the SARS-CoV-2 public health emergency.  Safety protocols were in place, including screening questions prior to the visit, additional usage of staff PPE, and extensive cleaning of exam room while observing appropriate contact time as indicated for disinfecting solutions.

## 2022-05-29 ENCOUNTER — Other Ambulatory Visit (HOSPITAL_COMMUNITY): Payer: Self-pay

## 2022-05-29 ENCOUNTER — Telehealth: Payer: Self-pay

## 2022-05-29 MED ORDER — ZEPBOUND 2.5 MG/0.5ML ~~LOC~~ SOAJ
2.5000 mg | SUBCUTANEOUS | 0 refills | Status: DC
  Filled 2022-05-29 – 2022-06-08 (×2): qty 2, 28d supply, fill #0

## 2022-05-29 MED ORDER — ZEPBOUND 7.5 MG/0.5ML ~~LOC~~ SOAJ
7.5000 mg | SUBCUTANEOUS | 0 refills | Status: DC
  Filled 2022-05-29: qty 2, 28d supply, fill #0

## 2022-05-29 MED ORDER — ZEPBOUND 5 MG/0.5ML ~~LOC~~ SOAJ
5.0000 mg | SUBCUTANEOUS | 0 refills | Status: DC
  Filled 2022-05-29: qty 2, 28d supply, fill #0

## 2022-05-29 NOTE — Addendum Note (Signed)
Addended by: Perlie Mayo on: 05/29/2022 08:43 AM   Modules accepted: Orders

## 2022-05-29 NOTE — Telephone Encounter (Signed)
Initiated Prior authorization [id number] 2.'5MG'$ /0.5ML pen-injectors bcbs pa platform Via: Covermymeds Case/Key:[id number] Status: approved  as of 05/29/22 Reason:The request has been approved. The authorization is effective from 05/30/2022 to 11/26/2022, as long as the member is enrolled in their current health plan. Notified Pt via: Mychart

## 2022-05-30 ENCOUNTER — Other Ambulatory Visit (HOSPITAL_COMMUNITY): Payer: Self-pay

## 2022-06-02 ENCOUNTER — Other Ambulatory Visit: Payer: Self-pay | Admitting: Family Medicine

## 2022-06-02 DIAGNOSIS — E663 Overweight: Secondary | ICD-10-CM

## 2022-06-03 LAB — HEMOGLOBIN A1C
Est. average glucose Bld gHb Est-mCnc: 85 mg/dL
Hgb A1c MFr Bld: 4.6 % — ABNORMAL LOW (ref 4.8–5.6)

## 2022-06-03 LAB — CMP14+EGFR
ALT: 11 IU/L (ref 0–32)
AST: 12 IU/L (ref 0–40)
Albumin/Globulin Ratio: 1.9 (ref 1.2–2.2)
Albumin: 4.9 g/dL (ref 4.0–5.0)
Alkaline Phosphatase: 82 IU/L (ref 44–121)
BUN/Creatinine Ratio: 11 (ref 9–23)
BUN: 11 mg/dL (ref 6–20)
Bilirubin Total: 0.5 mg/dL (ref 0.0–1.2)
CO2: 21 mmol/L (ref 20–29)
Calcium: 10.1 mg/dL (ref 8.7–10.2)
Chloride: 100 mmol/L (ref 96–106)
Creatinine, Ser: 0.96 mg/dL (ref 0.57–1.00)
Globulin, Total: 2.6 g/dL (ref 1.5–4.5)
Glucose: 73 mg/dL (ref 70–99)
Potassium: 3.9 mmol/L (ref 3.5–5.2)
Sodium: 140 mmol/L (ref 134–144)
Total Protein: 7.5 g/dL (ref 6.0–8.5)
eGFR: 82 mL/min/{1.73_m2} (ref >59)

## 2022-06-03 LAB — TSH: TSH: 0.845 u[IU]/mL (ref 0.450–4.500)

## 2022-06-04 ENCOUNTER — Other Ambulatory Visit: Payer: Self-pay

## 2022-06-04 ENCOUNTER — Other Ambulatory Visit (HOSPITAL_COMMUNITY): Payer: Self-pay

## 2022-06-08 ENCOUNTER — Other Ambulatory Visit (HOSPITAL_COMMUNITY): Payer: Self-pay

## 2022-06-16 ENCOUNTER — Other Ambulatory Visit (HOSPITAL_COMMUNITY): Payer: Self-pay

## 2022-07-02 ENCOUNTER — Other Ambulatory Visit: Payer: Self-pay | Admitting: Family Medicine

## 2022-07-02 DIAGNOSIS — F411 Generalized anxiety disorder: Secondary | ICD-10-CM

## 2022-07-02 MED ORDER — ESCITALOPRAM OXALATE 10 MG PO TABS: 10.0000 mg | ORAL_TABLET | Freq: Every day | ORAL | 3 refills | Status: DC

## 2022-07-02 NOTE — Progress Notes (Signed)
Pt called needing increase and refill on lexapro. Will send 10mg  which is the starting dose.

## 2022-07-21 ENCOUNTER — Other Ambulatory Visit (HOSPITAL_BASED_OUTPATIENT_CLINIC_OR_DEPARTMENT_OTHER): Payer: Self-pay

## 2022-09-29 ENCOUNTER — Ambulatory Visit (INDEPENDENT_AMBULATORY_CARE_PROVIDER_SITE_OTHER): Payer: 59 | Admitting: Family Medicine

## 2022-09-29 VITALS — BP 127/83 | HR 68 | Ht 62.0 in | Wt 164.0 lb

## 2022-09-29 DIAGNOSIS — R8290 Unspecified abnormal findings in urine: Secondary | ICD-10-CM

## 2022-09-29 DIAGNOSIS — R300 Dysuria: Secondary | ICD-10-CM

## 2022-09-29 DIAGNOSIS — N3000 Acute cystitis without hematuria: Secondary | ICD-10-CM | POA: Diagnosis not present

## 2022-09-29 LAB — POCT URINALYSIS DIP (CLINITEK)
Bilirubin, UA: NEGATIVE
Glucose, UA: NEGATIVE mg/dL
Ketones, POC UA: NEGATIVE mg/dL
Nitrite, UA: NEGATIVE
POC PROTEIN,UA: NEGATIVE
Spec Grav, UA: 1.01 (ref 1.010–1.025)
Urobilinogen, UA: 0.2 E.U./dL
pH, UA: 6 (ref 5.0–8.0)

## 2022-09-29 MED ORDER — CEPHALEXIN 500 MG PO CAPS: 500.0000 mg | ORAL_CAPSULE | Freq: Two times a day (BID) | ORAL | 0 refills | Status: AC

## 2022-09-29 NOTE — Progress Notes (Signed)
SUBJECTIVE: Caitlin Robles is a 32 y.o. female who complains of urinary frequency and dysuria x 3 days, without flank pain, fever, chills, or abnormal vaginal discharge or bleeding.   Urine dipstick completed. Sent for culture.   Pt seen by Dr. Tamera Punt. Rx sent to pharmacy.

## 2022-09-29 NOTE — Progress Notes (Signed)
POC UA Positive for leukocytes. Pt also has symptoms of uti with hesitancy. Will treat with keflex and culture  Charlton Amor, DO  [facility] at [facility] [phone] (phone) [phone] (fax)  [facility]

## 2022-10-02 LAB — URINE CULTURE
MICRO NUMBER:: 15124820
SPECIMEN QUALITY:: ADEQUATE

## 2022-10-05 ENCOUNTER — Other Ambulatory Visit: Payer: Self-pay | Admitting: Family Medicine

## 2022-10-05 MED ORDER — FLUCONAZOLE 150 MG PO TABS: 150.0000 mg | ORAL_TABLET | Freq: Once | ORAL | 0 refills | Status: DC

## 2022-10-05 MED ORDER — FLUCONAZOLE 150 MG PO TABS: 150.0000 mg | ORAL_TABLET | Freq: Once | ORAL | 0 refills | Status: AC

## 2022-12-03 ENCOUNTER — Encounter: Payer: Self-pay | Admitting: Family Medicine

## 2022-12-03 ENCOUNTER — Ambulatory Visit (INDEPENDENT_AMBULATORY_CARE_PROVIDER_SITE_OTHER): Payer: 59 | Admitting: Family Medicine

## 2022-12-03 VITALS — BP 121/87 | HR 81 | Resp 12 | Ht 62.0 in | Wt 164.0 lb

## 2022-12-03 DIAGNOSIS — Z0000 Encounter for general adult medical examination without abnormal findings: Secondary | ICD-10-CM

## 2022-12-03 NOTE — Progress Notes (Signed)
     Established patient visit   Patient: Caitlin Robles   DOB: 1991-03-13   32 y.o. Female  MRN: [id number] Visit Date: 12/03/2022  Today's healthcare provider: Charlton Amor, DO   Chief Complaint  Patient presents with   Annual Exam    SUBJECTIVE    Chief Complaint  Patient presents with   Annual Exam   HPI  Pt presents for wellness exam.   Diet: trying to eat healthier Exercise: limited   Family hx: HTN: Dad DM: Mom Cancer: none Heart disease: none  Social hx: Alcohol use: occasionally, one glass in a sitting Tobacco (chew, smoke): none Review of Systems  Constitutional:  Negative for activity change, fatigue and fever.  Respiratory:  Negative for cough and shortness of breath.   Cardiovascular:  Negative for chest pain.  Gastrointestinal:  Negative for abdominal pain.  Genitourinary:  Negative for difficulty urinating.       No outpatient medications have been marked as taking for the 12/03/22 encounter (Office Visit) with Charlton Amor, DO.    OBJECTIVE    BP 121/87   Pulse 81   Resp 12   Physical Exam Vitals and nursing note reviewed.  Constitutional:      General: She is not in acute distress.    Appearance: Normal appearance.  HENT:     Head: Normocephalic and atraumatic.     Right Ear: External ear normal.     Left Ear: External ear normal.     Nose: Nose normal.  Eyes:     Conjunctiva/sclera: Conjunctivae normal.  Cardiovascular:     Rate and Rhythm: Normal rate and regular rhythm.  Pulmonary:     Effort: Pulmonary effort is normal.     Breath sounds: Normal breath sounds.  Abdominal:     General: Abdomen is flat. Bowel sounds are normal.     Palpations: Abdomen is soft.  Neurological:     General: No focal deficit present.     Mental Status: She is alert and oriented to person, place, and time.  Psychiatric:        Mood and Affect: Mood normal.        Behavior: Behavior normal.        Thought Content: Thought content normal.         Judgment: Judgment normal.        ASSESSMENT & PLAN    Problem List Items Addressed This Visit       Other   Routine adult health maintenance - Primary   Relevant Orders   Basic Metabolic Panel (BMET)   CBC   Lipid panel    Return in about 1 year (around 12/03/2023) for wellness.      No orders of the defined types were placed in this encounter.   Orders Placed This Encounter  Procedures   Basic Metabolic Panel (BMET)   CBC   Lipid panel     Charlton Amor, DO  [facility] at [facility] [phone] (phone) [phone] (fax)  [facility]

## 2022-12-04 LAB — BASIC METABOLIC PANEL
BUN/Creatinine Ratio: 20 (ref 9–23)
BUN: 18 mg/dL (ref 6–20)
CO2: 21 mmol/L (ref 20–29)
Calcium: 10.3 mg/dL — ABNORMAL HIGH (ref 8.7–10.2)
Chloride: 102 mmol/L (ref 96–106)
Creatinine, Ser: 0.92 mg/dL (ref 0.57–1.00)
Glucose: 92 mg/dL (ref 70–99)
Potassium: 4.3 mmol/L (ref 3.5–5.2)
Sodium: 137 mmol/L (ref 134–144)
eGFR: 86 mL/min/{1.73_m2} (ref >59)

## 2022-12-04 LAB — CBC
Hematocrit: 40.1 % (ref 34.0–46.6)
Hemoglobin: 12.8 g/dL (ref 11.1–15.9)
MCH: 30.3 pg (ref 26.6–33.0)
MCHC: 31.9 g/dL (ref 31.5–35.7)
MCV: 95 fL (ref 79–97)
Platelets: 326 10*3/uL (ref 150–450)
RBC: 4.22 x10E6/uL (ref 3.77–5.28)
RDW: 10.8 % — ABNORMAL LOW (ref 11.7–15.4)
WBC: 5.7 10*3/uL (ref 3.4–10.8)

## 2022-12-04 LAB — LIPID PANEL
Chol/HDL Ratio: 3.4 ratio (ref 0.0–4.4)
Cholesterol, Total: 133 mg/dL (ref 100–199)
HDL: 39 mg/dL — ABNORMAL LOW (ref >39)
LDL Chol Calc (NIH): 74 mg/dL (ref 0–99)
Triglycerides: 111 mg/dL (ref 0–149)
VLDL Cholesterol Cal: 20 mg/dL (ref 5–40)
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
## 2022-12-21 ENCOUNTER — Other Ambulatory Visit: Payer: Self-pay | Admitting: Family Medicine

## 2022-12-21 ENCOUNTER — Other Ambulatory Visit (HOSPITAL_BASED_OUTPATIENT_CLINIC_OR_DEPARTMENT_OTHER): Payer: Self-pay

## 2022-12-21 ENCOUNTER — Other Ambulatory Visit (HOSPITAL_COMMUNITY): Payer: Self-pay

## 2022-12-21 MED ORDER — ZEPBOUND 2.5 MG/0.5ML ~~LOC~~ SOAJ
2.5000 mg | SUBCUTANEOUS | 0 refills | Status: DC
  Filled 2022-12-21: qty 2, 28d supply, fill #0

## 2022-12-21 MED ORDER — ZEPBOUND 2.5 MG/0.5ML ~~LOC~~ SOAJ: 2.5000 mg | SUBCUTANEOUS | 0 refills | Status: DC

## 2022-12-22 ENCOUNTER — Other Ambulatory Visit (HOSPITAL_COMMUNITY): Payer: Self-pay

## 2023-01-16 ENCOUNTER — Other Ambulatory Visit: Payer: Self-pay | Admitting: Family Medicine

## 2023-01-16 DIAGNOSIS — N3000 Acute cystitis without hematuria: Secondary | ICD-10-CM

## 2023-01-16 MED ORDER — CEPHALEXIN 500 MG PO CAPS
500.0000 mg | ORAL_CAPSULE | Freq: Two times a day (BID) | ORAL | 0 refills | Status: DC
Start: 2023-01-16 — End: 2023-07-01

## 2023-01-16 MED ORDER — CEPHALEXIN 500 MG PO CAPS
500.0000 mg | ORAL_CAPSULE | Freq: Two times a day (BID) | ORAL | 0 refills | Status: DC
Start: 2023-01-16 — End: 2023-01-16

## 2023-01-16 MED ORDER — NITROFURANTOIN MONOHYD MACRO 100 MG PO CAPS: 100.0000 mg | ORAL_CAPSULE | Freq: Two times a day (BID) | ORAL | 0 refills | Status: AC

## 2023-01-16 NOTE — Progress Notes (Signed)
Pt called sxs uti  Sending in keflex

## 2023-01-16 NOTE — Progress Notes (Signed)
Pt said pharmacy said keflex was too early to fill. Have changed to macrobid.

## 2023-01-18 ENCOUNTER — Encounter: Payer: Self-pay | Admitting: Medical-Surgical

## 2023-01-18 ENCOUNTER — Ambulatory Visit (INDEPENDENT_AMBULATORY_CARE_PROVIDER_SITE_OTHER): Payer: 59 | Admitting: Medical-Surgical

## 2023-01-18 ENCOUNTER — Telehealth (INDEPENDENT_AMBULATORY_CARE_PROVIDER_SITE_OTHER): Payer: Self-pay

## 2023-01-18 DIAGNOSIS — R300 Dysuria: Secondary | ICD-10-CM | POA: Diagnosis not present

## 2023-01-18 DIAGNOSIS — N898 Other specified noninflammatory disorders of vagina: Secondary | ICD-10-CM

## 2023-01-18 LAB — POCT URINALYSIS DIP (CLINITEK)
Bilirubin, UA: NEGATIVE
Blood, UA: NEGATIVE
Glucose, UA: NEGATIVE mg/dL
Ketones, POC UA: NEGATIVE mg/dL
Leukocytes, UA: NEGATIVE
Nitrite, UA: NEGATIVE
POC PROTEIN,UA: NEGATIVE
Spec Grav, UA: >=1.03 — AB (ref 1.010–1.025)
Urobilinogen, UA: 1 U/dL
pH, UA: 5.5 (ref 5.0–8.0)

## 2023-01-18 MED ORDER — FLUCONAZOLE 150 MG PO TABS
150.0000 mg | ORAL_TABLET | Freq: Once | ORAL | 0 refills | Status: AC
Start: 2023-01-18 — End: 2023-01-18

## 2023-01-18 NOTE — Telephone Encounter (Signed)
Order tests for UA, Urine Culture and Wet prep.

## 2023-01-18 NOTE — Progress Notes (Signed)
POCT UA with elevated specific gravity, otherwise negative. Sending for culture. Wet prep today. With recent start of oral antibiotics, suspect vaginal irritation is related to yeast. Sending Diflucan 150mg  x 1 dose empirically.   Medical screening examination/treatment was performed by qualified clinical staff member and as supervising provider I was immediately available for consultation/collaboration. I have reviewed documentation and agree with assessment and plan.  Thayer Ohm, DNP, APRN, FNP-BC [facility]

## 2023-01-20 LAB — WET PREP FOR TRICH, YEAST, CLUE
Clue Cell Exam: NEGATIVE
Trichomonas Exam: NEGATIVE
Yeast Exam: NEGATIVE

## 2023-01-20 LAB — URINE CULTURE: Organism ID, Bacteria: NO GROWTH

## 2023-02-02 ENCOUNTER — Other Ambulatory Visit: Payer: Self-pay | Admitting: Family Medicine

## 2023-02-02 DIAGNOSIS — B379 Candidiasis, unspecified: Secondary | ICD-10-CM

## 2023-02-02 DIAGNOSIS — L739 Follicular disorder, unspecified: Secondary | ICD-10-CM

## 2023-02-02 MED ORDER — DOXYCYCLINE HYCLATE 100 MG PO TABS
100.0000 mg | ORAL_TABLET | Freq: Two times a day (BID) | ORAL | 0 refills | Status: AC
Start: 2023-02-02 — End: 2023-02-09

## 2023-02-02 MED ORDER — FLUCONAZOLE 150 MG PO TABS
150.0000 mg | ORAL_TABLET | Freq: Once | ORAL | 0 refills | Status: AC
Start: 2023-02-02 — End: 2023-02-02

## 2023-02-02 NOTE — Progress Notes (Signed)
Pt has folliculitis. Will go ahead and treat with doxy. If no better will need referral to obgyn for I&D. Diflucan sent for yeast infection

## 2023-02-15 ENCOUNTER — Other Ambulatory Visit: Payer: Self-pay | Admitting: Family Medicine

## 2023-02-15 DIAGNOSIS — N898 Other specified noninflammatory disorders of vagina: Secondary | ICD-10-CM

## 2023-02-16 LAB — WET PREP FOR TRICH, YEAST, CLUE
Clue Cell Exam: NEGATIVE
Trichomonas Exam: NEGATIVE
Yeast Exam: NEGATIVE

## 2023-03-14 DIAGNOSIS — S29019A Strain of muscle and tendon of unspecified wall of thorax, initial encounter: Secondary | ICD-10-CM | POA: Diagnosis not present

## 2023-03-14 DIAGNOSIS — S46911A Strain of unspecified muscle, fascia and tendon at shoulder and upper arm level, right arm, initial encounter: Secondary | ICD-10-CM | POA: Diagnosis not present

## 2023-03-23 DIAGNOSIS — Z1330 Encounter for screening examination for mental health and behavioral disorders, unspecified: Secondary | ICD-10-CM | POA: Diagnosis not present

## 2023-03-23 DIAGNOSIS — Z630 Problems in relationship with spouse or partner: Secondary | ICD-10-CM | POA: Diagnosis not present

## 2023-03-23 DIAGNOSIS — Z01419 Encounter for gynecological examination (general) (routine) without abnormal findings: Secondary | ICD-10-CM | POA: Diagnosis not present

## 2023-03-23 DIAGNOSIS — Z3040 Encounter for surveillance of contraceptives, unspecified: Secondary | ICD-10-CM | POA: Diagnosis not present

## 2023-03-23 DIAGNOSIS — R635 Abnormal weight gain: Secondary | ICD-10-CM | POA: Diagnosis not present

## 2023-03-23 DIAGNOSIS — N979 Female infertility, unspecified: Secondary | ICD-10-CM | POA: Diagnosis not present

## 2023-03-23 DIAGNOSIS — Z6831 Body mass index (BMI) 31.0-31.9, adult: Secondary | ICD-10-CM | POA: Diagnosis not present

## 2023-03-29 DIAGNOSIS — S29019A Strain of muscle and tendon of unspecified wall of thorax, initial encounter: Secondary | ICD-10-CM | POA: Diagnosis not present

## 2023-03-29 DIAGNOSIS — S46911A Strain of unspecified muscle, fascia and tendon at shoulder and upper arm level, right arm, initial encounter: Secondary | ICD-10-CM | POA: Diagnosis not present

## 2023-04-13 ENCOUNTER — Other Ambulatory Visit: Payer: Self-pay | Admitting: Family Medicine

## 2023-04-13 ENCOUNTER — Other Ambulatory Visit (HOSPITAL_COMMUNITY): Payer: Self-pay

## 2023-04-13 DIAGNOSIS — L700 Acne vulgaris: Secondary | ICD-10-CM

## 2023-04-13 MED ORDER — DOXYCYCLINE HYCLATE 100 MG PO TABS
100.0000 mg | ORAL_TABLET | Freq: Every day | ORAL | 0 refills | Status: DC
Start: 2023-04-13 — End: 2023-07-01
  Filled 2023-04-13: qty 30, 30d supply, fill #0

## 2023-04-13 MED ORDER — BENZOYL PEROXIDE-ERYTHROMYCIN 5-3 % EX GEL
Freq: Two times a day (BID) | CUTANEOUS | 0 refills | Status: AC
  Filled 2023-04-13: qty 23.3, 30d supply, fill #0

## 2023-04-13 MED ORDER — TRETINOIN 0.1 % EX CREA
TOPICAL_CREAM | Freq: Every day | CUTANEOUS | 3 refills | Status: AC
  Filled 2023-04-13: qty 20, 10d supply, fill #0

## 2023-04-13 NOTE — Progress Notes (Signed)
 Pt messaged saying she would like a refill on her doxycycline  for acne as well as tretinoin  cream.   Will go ahead and do 3 month supply of doxycycline . No more recommended to help prevent resistance   Have also sent in tretinoin  cream and benzamycin  cream

## 2023-04-19 ENCOUNTER — Other Ambulatory Visit (HOSPITAL_COMMUNITY): Payer: Self-pay

## 2023-04-20 ENCOUNTER — Other Ambulatory Visit (HOSPITAL_COMMUNITY): Payer: Self-pay

## 2023-04-30 ENCOUNTER — Other Ambulatory Visit (HOSPITAL_COMMUNITY): Payer: Self-pay

## 2023-06-30 ENCOUNTER — Other Ambulatory Visit: Payer: Self-pay | Admitting: Family Medicine

## 2023-06-30 DIAGNOSIS — L700 Acne vulgaris: Secondary | ICD-10-CM

## 2023-07-01 ENCOUNTER — Other Ambulatory Visit: Payer: Self-pay | Admitting: Medical-Surgical

## 2023-07-01 ENCOUNTER — Other Ambulatory Visit: Payer: Self-pay

## 2023-07-01 ENCOUNTER — Encounter: Payer: Self-pay | Admitting: Medical-Surgical

## 2023-07-01 DIAGNOSIS — R300 Dysuria: Secondary | ICD-10-CM | POA: Diagnosis not present

## 2023-07-01 LAB — POCT URINALYSIS DIP (CLINITEK)
Bilirubin, UA: NEGATIVE
Glucose, UA: 100 mg/dL — AB
Ketones, POC UA: NEGATIVE mg/dL
Nitrite, UA: POSITIVE — AB
POC PROTEIN,UA: 100 — AB
Spec Grav, UA: 1.02 (ref 1.010–1.025)
Urobilinogen, UA: 2 U/dL — AB
pH, UA: 5 (ref 5.0–8.0)

## 2023-07-01 MED ORDER — NITROFURANTOIN MONOHYD MACRO 100 MG PO CAPS: 100.0000 mg | ORAL_CAPSULE | Freq: Two times a day (BID) | ORAL | 0 refills | Status: DC

## 2023-07-02 ENCOUNTER — Encounter: Payer: Self-pay | Admitting: Medical-Surgical

## 2023-07-04 ENCOUNTER — Encounter: Payer: Self-pay | Admitting: Medical-Surgical

## 2023-07-04 LAB — GC/CHLAMYDIA PROBE AMP
Chlamydia trachomatis, NAA: NEGATIVE
Neisseria Gonorrhoeae by PCR: NEGATIVE

## 2023-07-06 ENCOUNTER — Encounter: Payer: Self-pay | Admitting: Medical-Surgical

## 2023-07-06 ENCOUNTER — Other Ambulatory Visit (INDEPENDENT_AMBULATORY_CARE_PROVIDER_SITE_OTHER)

## 2023-07-06 DIAGNOSIS — Z9289 Personal history of other medical treatment: Secondary | ICD-10-CM | POA: Diagnosis not present

## 2023-07-06 LAB — WET PREP FOR TRICH, YEAST, CLUE
Clue Cell Exam: NEGATIVE
Trichomonas Exam: NEGATIVE
Yeast Exam: NEGATIVE

## 2023-07-06 LAB — URINE CULTURE

## 2023-07-06 LAB — POCT URINALYSIS DIP (CLINITEK)
Bilirubin, UA: NEGATIVE
Blood, UA: NEGATIVE
Glucose, UA: NEGATIVE mg/dL
Leukocytes, UA: NEGATIVE
Nitrite, UA: NEGATIVE
POC PROTEIN,UA: NEGATIVE
Spec Grav, UA: 1.02 (ref 1.010–1.025)
Urobilinogen, UA: 1 U/dL
pH, UA: 5.5 (ref 5.0–8.0)

## 2023-07-19 ENCOUNTER — Telehealth: Payer: Self-pay | Admitting: Family Medicine

## 2023-07-19 DIAGNOSIS — L700 Acne vulgaris: Secondary | ICD-10-CM

## 2023-07-19 MED ORDER — DOXYCYCLINE HYCLATE 100 MG PO CAPS: 100.0000 mg | ORAL_CAPSULE | Freq: Every day | ORAL | 1 refills | Status: DC

## 2023-07-19 NOTE — Telephone Encounter (Signed)
 Refill doxycycline.  Changed to capsules for cost.  Prescription sent to [facility].  Meds ordered this encounter  Medications   doxycycline (VIBRAMYCIN) 100 MG capsule    Sig: Take 1 capsule (100 mg total) by mouth daily.    Dispense:  90 capsule    Refill:  1

## 2023-08-12 ENCOUNTER — Encounter: Payer: Self-pay | Admitting: Family Medicine

## 2023-08-13 ENCOUNTER — Other Ambulatory Visit: Payer: Self-pay

## 2023-08-13 DIAGNOSIS — Z113 Encounter for screening for infections with a predominantly sexual mode of transmission: Secondary | ICD-10-CM | POA: Diagnosis not present

## 2023-08-13 DIAGNOSIS — N979 Female infertility, unspecified: Secondary | ICD-10-CM

## 2023-08-15 LAB — WET PREP FOR TRICH, YEAST, CLUE

## 2023-08-16 ENCOUNTER — Encounter: Payer: Self-pay | Admitting: Medical-Surgical

## 2023-08-16 LAB — SPECIMEN STATUS REPORT

## 2023-08-16 LAB — WET PREP FOR TRICH, YEAST, CLUE
Clue Cell Exam: NEGATIVE
Trichomonas Exam: NEGATIVE
Yeast Exam: NEGATIVE

## 2023-08-16 LAB — GC/CHLAMYDIA PROBE AMP

## 2023-08-16 NOTE — Addendum Note (Signed)
 Addended by: Bartolo Lights on: 08/16/2023 08:14 AM   Modules accepted: Orders

## 2023-08-17 ENCOUNTER — Other Ambulatory Visit: Payer: Self-pay

## 2023-08-17 LAB — WET PREP FOR TRICH, YEAST, CLUE

## 2023-08-17 LAB — SPECIMEN STATUS REPORT

## 2023-08-17 MED ORDER — ESCITALOPRAM OXALATE 5 MG PO TABS: 5.0000 mg | ORAL_TABLET | Freq: Every day | ORAL | 0 refills | Status: DC

## 2023-08-19 ENCOUNTER — Ambulatory Visit: Payer: Self-pay | Admitting: Medical-Surgical

## 2023-08-19 LAB — SPECIMEN STATUS REPORT

## 2023-08-19 LAB — GC/CHLAMYDIA PROBE AMP

## 2023-08-22 ENCOUNTER — Telehealth: Admitting: Family

## 2023-08-22 DIAGNOSIS — N898 Other specified noninflammatory disorders of vagina: Secondary | ICD-10-CM | POA: Diagnosis not present

## 2023-08-22 DIAGNOSIS — B3731 Acute candidiasis of vulva and vagina: Secondary | ICD-10-CM | POA: Diagnosis not present

## 2023-08-22 DIAGNOSIS — N760 Acute vaginitis: Secondary | ICD-10-CM | POA: Diagnosis not present

## 2023-08-22 DIAGNOSIS — Z113 Encounter for screening for infections with a predominantly sexual mode of transmission: Secondary | ICD-10-CM | POA: Diagnosis not present

## 2023-08-22 MED ORDER — FLUCONAZOLE 150 MG PO TABS: 150.0000 mg | ORAL_TABLET | ORAL | 0 refills | Status: DC | PRN

## 2023-08-22 NOTE — Progress Notes (Signed)

## 2023-08-24 ENCOUNTER — Other Ambulatory Visit: Payer: Self-pay

## 2023-08-24 DIAGNOSIS — B3731 Acute candidiasis of vulva and vagina: Secondary | ICD-10-CM

## 2023-08-24 DIAGNOSIS — Z113 Encounter for screening for infections with a predominantly sexual mode of transmission: Secondary | ICD-10-CM

## 2023-08-24 NOTE — Progress Notes (Signed)
 nu

## 2023-08-26 LAB — GC/CHLAMYDIA PROBE AMP
Chlamydia trachomatis, NAA: NEGATIVE
Neisseria Gonorrhoeae by PCR: NEGATIVE

## 2023-08-27 ENCOUNTER — Ambulatory Visit: Payer: Self-pay | Admitting: Physician Assistant

## 2023-08-27 MED ORDER — FLUCONAZOLE 150 MG PO TABS: ORAL_TABLET | ORAL | 0 refills | Status: DC

## 2023-08-27 NOTE — Progress Notes (Signed)
 Confirmed yeast infection. I will send 2 diflucan  tablets. Some results still pending.

## 2023-08-27 NOTE — Addendum Note (Signed)
 Addended by: Araceli Knight on: 08/27/2023 05:56 AM   Modules accepted: Orders

## 2023-08-28 LAB — NUSWAB VAGINITIS PLUS (VG+)
Candida albicans, NAA: POSITIVE — AB
Candida glabrata, NAA: NEGATIVE
Chlamydia trachomatis, NAA: NEGATIVE
Neisseria gonorrhoeae, NAA: NEGATIVE
Trich vag by NAA: NEGATIVE

## 2023-08-28 NOTE — Progress Notes (Signed)
 Only positive was yeast, which was treated. No STDs.

## 2023-09-14 ENCOUNTER — Other Ambulatory Visit: Payer: Self-pay | Admitting: Physician Assistant

## 2023-09-14 DIAGNOSIS — L910 Hypertrophic scar: Secondary | ICD-10-CM | POA: Insufficient documentation

## 2023-09-14 DIAGNOSIS — L700 Acne vulgaris: Secondary | ICD-10-CM | POA: Insufficient documentation

## 2023-09-14 NOTE — Progress Notes (Signed)
 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 Has general acne that she would like addressed. Referral to dermatology made.

## 2023-09-22 ENCOUNTER — Ambulatory Visit: Payer: Self-pay | Admitting: Physician Assistant

## 2023-09-22 ENCOUNTER — Other Ambulatory Visit (INDEPENDENT_AMBULATORY_CARE_PROVIDER_SITE_OTHER)

## 2023-09-22 DIAGNOSIS — N898 Other specified noninflammatory disorders of vagina: Secondary | ICD-10-CM | POA: Diagnosis not present

## 2023-09-22 LAB — POCT URINALYSIS DIP (CLINITEK)
Bilirubin, UA: NEGATIVE
Blood, UA: NEGATIVE
Glucose, UA: NEGATIVE mg/dL
Ketones, POC UA: NEGATIVE mg/dL
Leukocytes, UA: NEGATIVE
Nitrite, UA: NEGATIVE
POC PROTEIN,UA: NEGATIVE
Spec Grav, UA: 1.025 (ref 1.010–1.025)
Urobilinogen, UA: 1 U/dL
pH, UA: 6 (ref 5.0–8.0)

## 2023-09-22 NOTE — Progress Notes (Signed)
 UA in office looks good!

## 2023-09-24 LAB — NUSWAB VAGINITIS PLUS (VG+)

## 2023-09-24 NOTE — Progress Notes (Signed)
 No yeast!  Could use boric acid suppository(twice a week for one month) for moderate appearance of BV.

## 2023-09-25 LAB — NUSWAB VAGINITIS PLUS (VG+)
Candida albicans, NAA: NEGATIVE
Candida glabrata, NAA: NEGATIVE

## 2023-09-25 LAB — URINE CULTURE

## 2023-10-12 ENCOUNTER — Other Ambulatory Visit: Payer: Self-pay

## 2023-10-12 MED ORDER — DOXYCYCLINE HYCLATE 100 MG PO TABS: ORAL_TABLET | ORAL | 0 refills | Status: DC

## 2023-10-20 ENCOUNTER — Other Ambulatory Visit: Payer: Self-pay

## 2023-10-20 DIAGNOSIS — N898 Other specified noninflammatory disorders of vagina: Secondary | ICD-10-CM

## 2023-10-20 DIAGNOSIS — R300 Dysuria: Secondary | ICD-10-CM

## 2023-10-23 LAB — NUSWAB VAGINITIS PLUS (VG+)
Candida albicans, NAA: POSITIVE — AB
Candida glabrata, NAA: NEGATIVE
Chlamydia trachomatis, NAA: NEGATIVE
Neisseria gonorrhoeae, NAA: NEGATIVE
Trich vag by NAA: NEGATIVE

## 2023-10-24 LAB — URINE CULTURE

## 2023-10-25 ENCOUNTER — Ambulatory Visit: Payer: Self-pay | Admitting: Physician Assistant

## 2023-10-25 MED ORDER — FLUCONAZOLE 150 MG PO TABS
ORAL_TABLET | ORAL | 0 refills | Status: DC
Start: 2023-10-25 — End: 2023-12-13

## 2023-10-25 NOTE — Progress Notes (Signed)
 Yeast found. Will send diflucan  for 2 tablets. Consider boric acid suppositories weekly to help keep vaginal flora in check.

## 2023-11-05 DIAGNOSIS — Z3169 Encounter for other general counseling and advice on procreation: Secondary | ICD-10-CM | POA: Diagnosis not present

## 2023-11-12 ENCOUNTER — Other Ambulatory Visit: Payer: Self-pay | Admitting: Physician Assistant

## 2023-11-12 ENCOUNTER — Other Ambulatory Visit: Payer: Self-pay | Admitting: Family

## 2023-11-12 DIAGNOSIS — B3731 Acute candidiasis of vulva and vagina: Secondary | ICD-10-CM

## 2023-11-22 ENCOUNTER — Other Ambulatory Visit (INDEPENDENT_AMBULATORY_CARE_PROVIDER_SITE_OTHER): Payer: Self-pay

## 2023-11-22 DIAGNOSIS — N898 Other specified noninflammatory disorders of vagina: Secondary | ICD-10-CM | POA: Diagnosis not present

## 2023-11-22 LAB — POCT URINALYSIS DIP (CLINITEK)
Bilirubin, UA: NEGATIVE
Blood, UA: NEGATIVE
Glucose, UA: NEGATIVE mg/dL
Ketones, POC UA: NEGATIVE mg/dL
Leukocytes, UA: NEGATIVE
Nitrite, UA: NEGATIVE
Spec Grav, UA: 1.025 (ref 1.010–1.025)
Urobilinogen, UA: 0.2 U/dL
pH, UA: 6 (ref 5.0–8.0)

## 2023-11-23 ENCOUNTER — Ambulatory Visit: Payer: Self-pay | Admitting: Physician Assistant

## 2023-11-23 MED ORDER — METRONIDAZOLE 500 MG PO TABS: 500.0000 mg | ORAL_TABLET | Freq: Two times a day (BID) | ORAL | 0 refills | Status: DC

## 2023-11-23 NOTE — Progress Notes (Signed)
 Bacterial vaginosis. No yeast. Will send metronidazole  to treat. You are going to need to do boric acid start weekly to see if you can prevent.

## 2023-11-24 LAB — URINE CULTURE

## 2023-11-26 LAB — NUSWAB VAGINITIS PLUS (VG+)
Atopobium vaginae: HIGH {score} — AB
BVAB 2: HIGH {score} — AB
Candida albicans, NAA: NEGATIVE
Candida glabrata, NAA: NEGATIVE
Megasphaera 1: HIGH {score} — AB

## 2023-11-26 LAB — SPECIMEN STATUS REPORT

## 2023-11-26 NOTE — Progress Notes (Signed)
 No bacteria found on urine culture.

## 2023-12-09 ENCOUNTER — Other Ambulatory Visit: Payer: Self-pay

## 2023-12-09 DIAGNOSIS — N926 Irregular menstruation, unspecified: Secondary | ICD-10-CM | POA: Diagnosis not present

## 2023-12-10 ENCOUNTER — Ambulatory Visit: Payer: Self-pay | Admitting: Physician Assistant

## 2023-12-10 LAB — BETA HCG QUANT (REF LAB): hCG Quant: <1 m[IU]/mL

## 2023-12-10 NOTE — Progress Notes (Signed)
Pt is not pregnant

## 2023-12-13 ENCOUNTER — Encounter: Payer: Self-pay | Admitting: Physician Assistant

## 2023-12-13 ENCOUNTER — Ambulatory Visit (INDEPENDENT_AMBULATORY_CARE_PROVIDER_SITE_OTHER): Admitting: Physician Assistant

## 2023-12-13 VITALS — BP 130/82 | Ht 62.0 in | Wt 162.0 lb

## 2023-12-13 DIAGNOSIS — Z0000 Encounter for general adult medical examination without abnormal findings: Secondary | ICD-10-CM

## 2023-12-13 DIAGNOSIS — R5383 Other fatigue: Secondary | ICD-10-CM

## 2023-12-13 DIAGNOSIS — Z13220 Encounter for screening for lipoid disorders: Secondary | ICD-10-CM

## 2023-12-13 DIAGNOSIS — F411 Generalized anxiety disorder: Secondary | ICD-10-CM | POA: Diagnosis not present

## 2023-12-13 DIAGNOSIS — E559 Vitamin D deficiency, unspecified: Secondary | ICD-10-CM

## 2023-12-13 DIAGNOSIS — Z131 Encounter for screening for diabetes mellitus: Secondary | ICD-10-CM

## 2023-12-13 DIAGNOSIS — Z1329 Encounter for screening for other suspected endocrine disorder: Secondary | ICD-10-CM | POA: Diagnosis not present

## 2023-12-13 DIAGNOSIS — Z30011 Encounter for initial prescription of contraceptive pills: Secondary | ICD-10-CM | POA: Diagnosis not present

## 2023-12-13 MED ORDER — ESCITALOPRAM OXALATE 5 MG PO TABS: 5.0000 mg | ORAL_TABLET | Freq: Every day | ORAL | 1 refills | Status: AC

## 2023-12-13 MED ORDER — DROSPIRENONE-ETHINYL ESTRADIOL 3-0.02 MG PO TABS: 1.0000 | ORAL_TABLET | Freq: Every day | ORAL | 4 refills | Status: AC

## 2023-12-13 NOTE — Patient Instructions (Signed)

## 2023-12-13 NOTE — Progress Notes (Unsigned)
   Established Patient Office Visit  Subjective   Patient ID: Caitlin Robles, female    DOB: [birth year]  Age: 33 y.o. MRN: [id number]  No chief complaint on file.   HPI  {History (Optional):23778}  ROS    Objective:     BP (!) 138/91   Ht 5' 2 (1.575 m)   Wt 162 lb (73.5 kg)   SpO2 100%   BMI 29.63 kg/m  {Vitals History (Optional):23777}  Physical Exam   No results found for any visits on 12/13/23.  {Labs (Optional):23779}  The ASCVD Risk score (Arnett DK, et al., 2019) failed to calculate for the following reasons:   The 2019 ASCVD risk score is only valid for ages 85 to 5    Assessment & Plan:   Problem List Items Addressed This Visit   None   No follow-ups on file.    Careena Degraffenreid, PA-C

## 2023-12-14 ENCOUNTER — Encounter: Payer: Self-pay | Admitting: Physician Assistant

## 2023-12-14 DIAGNOSIS — Z0000 Encounter for general adult medical examination without abnormal findings: Secondary | ICD-10-CM | POA: Diagnosis not present

## 2023-12-14 DIAGNOSIS — Z13220 Encounter for screening for lipoid disorders: Secondary | ICD-10-CM | POA: Diagnosis not present

## 2023-12-14 DIAGNOSIS — Z1329 Encounter for screening for other suspected endocrine disorder: Secondary | ICD-10-CM | POA: Diagnosis not present

## 2023-12-14 DIAGNOSIS — Z131 Encounter for screening for diabetes mellitus: Secondary | ICD-10-CM | POA: Diagnosis not present

## 2023-12-14 DIAGNOSIS — R5383 Other fatigue: Secondary | ICD-10-CM | POA: Diagnosis not present

## 2023-12-14 NOTE — Progress Notes (Signed)
 Complete physical exam  Patient: Caitlin Robles   DOB: 1991/03/13   33 y.o. Female  MRN: [id number]  Subjective:    No chief complaint on file.   Caitlin Robles is a 33 y.o. female who presents today for a complete physical exam. She reports consuming a general diet. The patient does not participate in regular exercise at present. She generally feels well. She reports sleeping well. She does have additional problems to discuss today.   She wants to restart birth control for her acne. She also wants labs because she is tired all the time.    Most recent fall risk assessment:    12/14/2023    7:13 AM  Fall Risk   Falls in the past year? 0  Number falls in past yr: 0  Injury with Fall? 0  Risk for fall due to : No Fall Risks  Follow up Falls evaluation completed     Most recent depression screenings:    12/14/2023    7:13 AM 03/12/2022    8:44 AM  PHQ 2/9 Scores  PHQ - 2 Score 0 0  PHQ- 9 Score 3 0    Vision:Within last year, Dental: No current dental problems and Receives regular dental care, and STD: The patient denies history of sexually transmitted disease.  Patient Active Problem List   Diagnosis Date Noted   Acne vulgaris 09/14/2023   Keloid of skin 09/14/2023   Routine adult health maintenance 12/03/2022   Obesity (BMI 30.0-34.9) 05/28/2022   GAD (generalized anxiety disorder) 03/12/2022   Overweight 11/17/2019   History of colon resection    Infertility, female    Chronic constipation 12/21/2018   Vitamin D  deficiency 07/19/2017   Past Medical History:  Diagnosis Date   Chronic constipation    Herpes simplex type 1 infection 07/19/2017   History of colon resection    as infant , premature birth,  Nov 22, 1990   Infertility, female    Pelvic adhesions    Vitamin D  deficiency    Past Surgical History:  Procedure Laterality Date   COLON SURGERY  01/30/93   hemicolectomy , she was premature   LAPAROSCOPIC UNILATERAL SALPINGECTOMY Left 08/18/2019   Procedure:  DIAGNOSTIC LAPAROSCOPY, , LYSIS OF ADHESIONS, CHROMOPERTUBATION;  Surgeon: Yalcinkaya, Tamer, MD;  Location: [facility];  Service: Gynecology;  Laterality: Left;   Family History  Problem Relation Age of Onset   Healthy Mother    Asthma Brother    Diabetes Maternal Grandmother    Hypertension Maternal Grandmother    Breast cancer Maternal Grandmother    Thyroid  disease Maternal Grandmother    Diabetes Maternal Grandfather    Hypertension Maternal Grandfather    Diabetes Paternal Grandmother    Hypertension Paternal Grandmother    Diabetes Paternal Grandfather    Heart attack Paternal Grandfather    Hypertension Paternal Grandfather    Asthma Father    Hypertension Father    No Known Allergies    Patient Care Team: Blue Ball, Yazmyne Sara L, PA-C as PCP - General (Family Medicine) Livingston Rigg, MD as Consulting Physician (Dermatology)   Outpatient Medications Prior to Visit  Medication Sig   benzoyl peroxide -erythromycin  (BENZAMYCIN ) gel Apply topically 2 (two) times daily.   Multiple Vitamins-Minerals (ONE-A-DAY WOMENS PO) Take by mouth daily.   tretinoin  (RETIN-A ) 0.1 % cream Apply topically at bedtime.   [DISCONTINUED] escitalopram  (LEXAPRO ) 5 MG tablet Take 1 tablet (5 mg total) by mouth daily.   doxycycline  (VIBRA -TABS) 100 MG tablet Twice  a day for 14 days, then once a day for 3 months. (Patient not taking: Reported on 12/13/2023)   [DISCONTINUED] doxycycline  (VIBRAMYCIN ) 100 MG capsule Take 1 capsule (100 mg total) by mouth daily. (Patient not taking: Reported on 12/13/2023)   [DISCONTINUED] fluconazole  (DIFLUCAN ) 150 MG tablet Take 1 tablet (150 mg total) by mouth every three (3) days as needed. (Patient not taking: Reported on 12/13/2023)   [DISCONTINUED] fluconazole  (DIFLUCAN ) 150 MG tablet Take one now and repeat in 48 hours. (Patient not taking: Reported on 12/13/2023)   [DISCONTINUED] metroNIDAZOLE  (FLAGYL ) 500 MG tablet Take 1 tablet (500 mg total) by mouth 2 (two)  times daily. (Patient not taking: Reported on 12/13/2023)   [DISCONTINUED] nitrofurantoin , macrocrystal-monohydrate, (MACROBID ) 100 MG capsule Take 1 capsule (100 mg total) by mouth 2 (two) times daily. (Patient not taking: Reported on 12/13/2023)   [DISCONTINUED] tirzepatide  (ZEPBOUND ) 2.5 MG/0.5ML Pen Inject 2.5 mg into the skin once a week. (Patient not taking: Reported on 12/13/2023)   No facility-administered medications prior to visit.    Review of Systems  All other systems reviewed and are negative.         Objective:     BP (!) 138/91   Ht 5' 2 (1.575 m)   Wt 162 lb (73.5 kg)   SpO2 100%   BMI 29.63 kg/m  BP Readings from Last 3 Encounters:  12/13/23 (!) 138/91  12/03/22 121/87  09/29/22 127/83   Wt Readings from Last 3 Encounters:  12/13/23 162 lb (73.5 kg)  12/03/22 164 lb (74.4 kg)  09/29/22 164 lb (74.4 kg)      Physical Exam      Assessment & Plan:    Routine Health Maintenance and Physical Exam  Immunization History  Administered Date(s) Administered   Influenza-Unspecified 01/04/2022   PFIZER(Purple Top)SARS-COV-2 Vaccination 11/15/2019, 12/06/2019   PPD Test 05/05/2019   Td 12/14/2016    Health Maintenance  Topic Date Due   Hepatitis B Vaccines 19-59 Average Risk (1 of 3 - 19+ 3-dose series) Never done   HPV VACCINES (1 - 3-dose SCDM series) Never done   Cervical Cancer Screening (HPV/Pap Cotest)  11/16/2023   COVID-19 Vaccine (3 - 2025-26 season) 12/30/2023 (Originally 12/06/2023)   Influenza Vaccine  07/04/2024 (Originally 11/05/2023)   DTaP/Tdap/Td (2 - Tdap) 12/15/2026   Hepatitis C Screening  Completed   HIV Screening  Completed   Pneumococcal Vaccine  Aged Out   Meningococcal B Vaccine  Aged Out    Discussed health benefits of physical activity, and encouraged her to engage in regular exercise appropriate for her age and condition.  SABRA.Diagnoses and all orders for this visit:  Routine physical examination -     Lipid panel -      CMP14+EGFR -     TSH + free T4 -     CBC w/Diff/Platelet -     VITAMIN D  25 Hydroxy (Vit-D Deficiency, Fractures) -     B12 and Folate Panel  Screening for lipid disorders -     Lipid panel  Screening for diabetes mellitus -     CMP14+EGFR  No energy -     TSH + free T4 -     CBC w/Diff/Platelet -     VITAMIN D  25 Hydroxy (Vit-D Deficiency, Fractures) -     B12 and Folate Panel  Screening for thyroid  disorder -     TSH + free T4  Encounter for initial prescription of contraceptive pills -     drospirenone -ethinyl  estradiol  (YAZ) 3-0.02 MG tablet; Take 1 tablet by mouth daily.  GAD (generalized anxiety disorder) -     escitalopram  (LEXAPRO ) 5 MG tablet; Take 1 tablet (5 mg total) by mouth daily.  Vitamin D  deficiency -     VITAMIN D  25 Hydroxy (Vit-D Deficiency, Fractures)   .SABRA Discussed 150 minutes of exercise a week.  Encouraged vitamin D  1000 units and Calcium 1300mg  or 4 servings of dairy a day.  Fasting labs ordered today PHQ no concerns GAD not to goal, encouraged to start lexapro  daily and be consistent Consider counseling to help cope with anxiety No falls Pap UTD but we need to get copy from GYN Start yaz for birth control and acne control Will get flu shot at work   Return in about 6 months (around 06/11/2024).     Amadi Frady, PA-C

## 2023-12-15 ENCOUNTER — Ambulatory Visit: Payer: Self-pay | Admitting: Physician Assistant

## 2023-12-15 LAB — CBC WITH DIFFERENTIAL/PLATELET
Basophils Absolute: 0 x10E3/uL (ref 0.0–0.2)
Basos: 1 %
EOS (ABSOLUTE): 0.1 x10E3/uL (ref 0.0–0.4)
Eos: 1 %
Hematocrit: 40.6 % (ref 34.0–46.6)
Hemoglobin: 13 g/dL (ref 11.1–15.9)
Immature Grans (Abs): 0 x10E3/uL (ref 0.0–0.1)
Immature Granulocytes: 0 %
Lymphocytes Absolute: 2.1 x10E3/uL (ref 0.7–3.1)
Lymphs: 39 %
MCH: 30.6 pg (ref 26.6–33.0)
MCHC: 32 g/dL (ref 31.5–35.7)
MCV: 96 fL (ref 79–97)
Monocytes Absolute: 0.4 x10E3/uL (ref 0.1–0.9)
Monocytes: 7 %
Neutrophils Absolute: 2.8 x10E3/uL (ref 1.4–7.0)
Neutrophils: 52 %
Platelets: 330 x10E3/uL (ref 150–450)
RBC: 4.25 x10E6/uL (ref 3.77–5.28)
RDW: 11.1 % — ABNORMAL LOW (ref 11.7–15.4)
WBC: 5.4 x10E3/uL (ref 3.4–10.8)

## 2023-12-15 LAB — CMP14+EGFR
ALT: 17 IU/L (ref 0–32)
AST: 15 IU/L (ref 0–40)
Albumin: 4.7 g/dL (ref 3.9–4.9)
Alkaline Phosphatase: 76 IU/L (ref 44–121)
BUN/Creatinine Ratio: 8 — ABNORMAL LOW (ref 9–23)
BUN: 7 mg/dL (ref 6–20)
Bilirubin Total: 0.8 mg/dL (ref 0.0–1.2)
CO2: 22 mmol/L (ref 20–29)
Calcium: 9.9 mg/dL (ref 8.7–10.2)
Chloride: 103 mmol/L (ref 96–106)
Creatinine, Ser: 0.9 mg/dL (ref 0.57–1.00)
Globulin, Total: 2.6 g/dL (ref 1.5–4.5)
Glucose: 87 mg/dL (ref 70–99)
Potassium: 4 mmol/L (ref 3.5–5.2)
Sodium: 139 mmol/L (ref 134–144)
Total Protein: 7.3 g/dL (ref 6.0–8.5)
eGFR: 88 mL/min/1.73 (ref >59)

## 2023-12-15 LAB — TSH+FREE T4
Free T4: 1.38 ng/dL (ref 0.82–1.77)
TSH: 1.68 u[IU]/mL (ref 0.450–4.500)

## 2023-12-15 LAB — B12 AND FOLATE PANEL
Folate: 5 ng/mL (ref >3.0)
Vitamin B-12: 420 pg/mL (ref 232–1245)

## 2023-12-15 LAB — LIPID PANEL
Chol/HDL Ratio: 3.3 ratio (ref 0.0–4.4)
Cholesterol, Total: 150 mg/dL (ref 100–199)
HDL: 46 mg/dL (ref >39)
LDL Chol Calc (NIH): 87 mg/dL (ref 0–99)
Triglycerides: 87 mg/dL (ref 0–149)
VLDL Cholesterol Cal: 17 mg/dL (ref 5–40)

## 2023-12-15 LAB — VITAMIN D 25 HYDROXY (VIT D DEFICIENCY, FRACTURES): Vit D, 25-Hydroxy: 24.5 ng/mL — ABNORMAL LOW (ref 30.0–100.0)

## 2023-12-15 NOTE — Progress Notes (Signed)
 Miya,   B12 normal.  Thyroid  looks great.  Cholesterol looks great.  Vitamin D  low, start d3 2000 units a day.  Hemoglobin looks good.  Kidney, liver, glucose looks good.

## 2023-12-16 ENCOUNTER — Ambulatory Visit (INDEPENDENT_AMBULATORY_CARE_PROVIDER_SITE_OTHER)

## 2023-12-16 VITALS — BP 110/70

## 2023-12-16 DIAGNOSIS — E538 Deficiency of other specified B group vitamins: Secondary | ICD-10-CM

## 2023-12-16 MED ORDER — CYANOCOBALAMIN 1000 MCG/ML IJ SOLN
1000.0000 ug | Freq: Once | INTRAMUSCULAR | Status: AC
  Administered 2023-12-16: 1000 ug via INTRAMUSCULAR

## 2023-12-16 NOTE — Progress Notes (Signed)
 Injection was tolerated well by the patient. (See MAR for injection details)

## 2023-12-23 ENCOUNTER — Other Ambulatory Visit: Payer: Self-pay

## 2023-12-23 DIAGNOSIS — Z113 Encounter for screening for infections with a predominantly sexual mode of transmission: Secondary | ICD-10-CM | POA: Diagnosis not present

## 2023-12-23 DIAGNOSIS — N898 Other specified noninflammatory disorders of vagina: Secondary | ICD-10-CM

## 2023-12-24 ENCOUNTER — Other Ambulatory Visit: Payer: Self-pay | Admitting: Physician Assistant

## 2023-12-24 MED ORDER — FLUCONAZOLE 150 MG PO TABS
150.0000 mg | ORAL_TABLET | Freq: Once | ORAL | 0 refills | Status: AC
Start: 2023-12-24 — End: 2023-12-24

## 2023-12-27 ENCOUNTER — Ambulatory Visit: Payer: Self-pay | Admitting: Physician Assistant

## 2023-12-27 NOTE — Progress Notes (Signed)
 All vaginal testing negative.

## 2023-12-28 LAB — NUSWAB VAGINITIS PLUS (VG+)
Candida albicans, NAA: NEGATIVE
Candida glabrata, NAA: NEGATIVE
Chlamydia trachomatis, NAA: POSITIVE — AB
Neisseria gonorrhoeae, NAA: NEGATIVE
Trich vag by NAA: NEGATIVE

## 2023-12-28 LAB — SPECIMEN STATUS REPORT

## 2023-12-28 MED ORDER — DOXYCYCLINE HYCLATE 100 MG PO TABS: 100.0000 mg | ORAL_TABLET | Freq: Two times a day (BID) | ORAL | 0 refills | Status: DC

## 2023-12-29 MED ORDER — DOXYCYCLINE HYCLATE 100 MG PO TABS: 100.0000 mg | ORAL_TABLET | Freq: Two times a day (BID) | ORAL | 2 refills | Status: AC

## 2023-12-30 DIAGNOSIS — L700 Acne vulgaris: Secondary | ICD-10-CM | POA: Diagnosis not present

## 2023-12-30 DIAGNOSIS — L910 Hypertrophic scar: Secondary | ICD-10-CM | POA: Diagnosis not present

## 2023-12-30 DIAGNOSIS — L309 Dermatitis, unspecified: Secondary | ICD-10-CM | POA: Diagnosis not present

## 2024-01-10 ENCOUNTER — Other Ambulatory Visit: Payer: Self-pay | Admitting: Physician Assistant

## 2024-01-10 MED ORDER — FLUCONAZOLE 150 MG PO TABS
150.0000 mg | ORAL_TABLET | Freq: Once | ORAL | 0 refills | Status: AC
Start: 2024-01-10 — End: 2024-01-10

## 2024-02-09 DIAGNOSIS — K5900 Constipation, unspecified: Secondary | ICD-10-CM | POA: Diagnosis not present

## 2024-02-09 DIAGNOSIS — R1013 Epigastric pain: Secondary | ICD-10-CM | POA: Diagnosis not present

## 2024-02-27 ENCOUNTER — Telehealth: Admitting: Nurse Practitioner

## 2024-02-27 ENCOUNTER — Encounter: Admitting: Nurse Practitioner

## 2024-02-27 DIAGNOSIS — N760 Acute vaginitis: Secondary | ICD-10-CM

## 2024-02-27 DIAGNOSIS — B3731 Acute candidiasis of vulva and vagina: Secondary | ICD-10-CM | POA: Diagnosis not present

## 2024-02-27 MED ORDER — FLUCONAZOLE 150 MG PO TABS: 150.0000 mg | ORAL_TABLET | Freq: Once | ORAL | 0 refills | Status: AC

## 2024-02-27 NOTE — Progress Notes (Signed)

## 2024-02-27 NOTE — Progress Notes (Signed)
 Duplicate visit

## 2024-02-28 ENCOUNTER — Ambulatory Visit (INDEPENDENT_AMBULATORY_CARE_PROVIDER_SITE_OTHER)

## 2024-02-28 DIAGNOSIS — R300 Dysuria: Secondary | ICD-10-CM | POA: Diagnosis not present

## 2024-02-28 DIAGNOSIS — N898 Other specified noninflammatory disorders of vagina: Secondary | ICD-10-CM | POA: Diagnosis not present

## 2024-02-28 NOTE — Progress Notes (Signed)
   Subjective:    Patient ID: Caitlin Robles, female    DOB: 22-Jun-1990, 33 y.o.   MRN: [id number]  HPI  Patient is here for vaginal discomfort x 3days itching, and mild odor. Denies fever, chills, CP, or medication changes.  Review of Systems     Objective:   Physical Exam        Assessment & Plan:   Collected a vaginal swab and sent off for processing.

## 2024-02-29 DIAGNOSIS — L309 Dermatitis, unspecified: Secondary | ICD-10-CM | POA: Diagnosis not present

## 2024-02-29 DIAGNOSIS — L538 Other specified erythematous conditions: Secondary | ICD-10-CM | POA: Diagnosis not present

## 2024-02-29 DIAGNOSIS — L910 Hypertrophic scar: Secondary | ICD-10-CM | POA: Diagnosis not present

## 2024-02-29 DIAGNOSIS — L700 Acne vulgaris: Secondary | ICD-10-CM | POA: Diagnosis not present

## 2024-03-01 ENCOUNTER — Ambulatory Visit: Payer: Self-pay | Admitting: Family Medicine

## 2024-03-01 LAB — NUSWAB VAGINITIS PLUS (VG+)
Candida albicans, NAA: NEGATIVE
Candida glabrata, NAA: NEGATIVE
Chlamydia trachomatis, NAA: NEGATIVE
Neisseria gonorrhoeae, NAA: NEGATIVE
Trich vag by NAA: NEGATIVE

## 2024-03-01 NOTE — Progress Notes (Signed)
 Your lab work is within acceptable range and there are no concerning findings.   ?

## 2024-04-28 ENCOUNTER — Other Ambulatory Visit: Payer: Self-pay | Admitting: Physician Assistant

## 2024-04-28 DIAGNOSIS — N898 Other specified noninflammatory disorders of vagina: Secondary | ICD-10-CM

## 2024-04-28 MED ORDER — METRONIDAZOLE 500 MG PO TABS: 500.0000 mg | ORAL_TABLET | Freq: Two times a day (BID) | ORAL | 0 refills | Status: AC

## 2024-04-28 NOTE — Progress Notes (Signed)
 Pt is having vaginal odor and discharge for past week. Pt has tried doxycycline  and diflucan  with little relief. Hx of BV.

## 2024-05-03 ENCOUNTER — Other Ambulatory Visit: Payer: Self-pay

## 2024-05-03 ENCOUNTER — Ambulatory Visit: Admitting: Dermatology

## 2024-05-03 DIAGNOSIS — N898 Other specified noninflammatory disorders of vagina: Secondary | ICD-10-CM

## 2024-05-03 DIAGNOSIS — B3731 Acute candidiasis of vulva and vagina: Secondary | ICD-10-CM

## 2024-05-05 ENCOUNTER — Ambulatory Visit: Payer: Self-pay | Admitting: Physician Assistant

## 2024-05-05 LAB — NUSWAB VAGINITIS PLUS (VG+)
Atopobium vaginae: HIGH {score} — AB
BVAB 2: HIGH {score} — AB
Candida albicans, NAA: NEGATIVE
Candida glabrata, NAA: NEGATIVE
Chlamydia trachomatis, NAA: NEGATIVE
Megasphaera 1: HIGH {score} — AB
Neisseria gonorrhoeae, NAA: NEGATIVE
Trich vag by NAA: NEGATIVE

## 2024-05-05 LAB — SPECIMEN STATUS REPORT

## 2024-05-05 NOTE — Progress Notes (Signed)
 Tell her to come talk to me! She has BV. I need to find out did she finish metronidazole ? She is getting recurrent BV.   No yeast or STDs.
